# Patient Record
Sex: Male | Born: 1965 | Hispanic: Yes | Marital: Married | Smoking: Never smoker
Health system: Southern US, Community
[De-identification: ages and names within clinical notes are randomized; demographics above are authoritative.]

## PROBLEM LIST (undated history)

## (undated) DIAGNOSIS — I10 Essential (primary) hypertension: Secondary | ICD-10-CM

## (undated) HISTORY — PX: VASECTOMY: SHX75

## (undated) HISTORY — PX: FINGER SURGERY: SHX640

---

## 1999-05-24 ENCOUNTER — Emergency Department (HOSPITAL_COMMUNITY): Admission: EM | Admit: 1999-05-24 | Discharge: 1999-05-24 | Payer: Self-pay | Admitting: Emergency Medicine

## 2004-09-14 ENCOUNTER — Emergency Department (HOSPITAL_COMMUNITY): Admission: EM | Admit: 2004-09-14 | Discharge: 2004-09-14 | Payer: Self-pay | Admitting: Emergency Medicine

## 2004-09-23 ENCOUNTER — Ambulatory Visit (HOSPITAL_COMMUNITY): Admission: RE | Admit: 2004-09-23 | Discharge: 2004-09-23 | Payer: Self-pay | Admitting: Orthopaedic Surgery

## 2014-01-24 ENCOUNTER — Ambulatory Visit (INDEPENDENT_AMBULATORY_CARE_PROVIDER_SITE_OTHER): Payer: BC Managed Care – PPO | Admitting: Physician Assistant

## 2014-01-24 VITALS — BP 128/82 | HR 55 | Temp 98.0°F | Resp 18 | Ht 64.0 in | Wt 176.4 lb

## 2014-01-24 DIAGNOSIS — Z130 Encounter for screening for diseases of the blood and blood-forming organs and certain disorders involving the immune mechanism: Secondary | ICD-10-CM

## 2014-01-24 DIAGNOSIS — Z23 Encounter for immunization: Secondary | ICD-10-CM

## 2014-01-24 DIAGNOSIS — Z1329 Encounter for screening for other suspected endocrine disorder: Secondary | ICD-10-CM

## 2014-01-24 DIAGNOSIS — Z131 Encounter for screening for diabetes mellitus: Secondary | ICD-10-CM

## 2014-01-24 DIAGNOSIS — Z13220 Encounter for screening for lipoid disorders: Secondary | ICD-10-CM

## 2014-01-24 DIAGNOSIS — Z0000 Encounter for general adult medical examination without abnormal findings: Secondary | ICD-10-CM

## 2014-01-24 DIAGNOSIS — Z1159 Encounter for screening for other viral diseases: Secondary | ICD-10-CM

## 2014-01-24 DIAGNOSIS — Z1211 Encounter for screening for malignant neoplasm of colon: Secondary | ICD-10-CM

## 2014-01-24 DIAGNOSIS — Z125 Encounter for screening for malignant neoplasm of prostate: Secondary | ICD-10-CM

## 2014-01-24 LAB — POCT CBC
Granulocyte percent: 55.7 %G (ref 37–80)
HEMATOCRIT: 50.3 % (ref 43.5–53.7)
HEMOGLOBIN: 16.3 g/dL (ref 14.1–18.1)
LYMPH, POC: 2 (ref 0.6–3.4)
MCH: 30.2 pg (ref 27–31.2)
MCHC: 32.5 g/dL (ref 31.8–35.4)
MCV: 92.8 fL (ref 80–97)
MID (cbc): 0.2 (ref 0–0.9)
MPV: 7.8 fL (ref 0–99.8)
POC Granulocyte: 2.8 (ref 2–6.9)
POC LYMPH PERCENT: 39.8 %L (ref 10–50)
POC MID %: 4.5 % (ref 0–12)
Platelet Count, POC: 214 10*3/uL (ref 142–424)
RBC: 5.42 M/uL (ref 4.69–6.13)
RDW, POC: 12.8 %
WBC: 5.1 10*3/uL (ref 4.6–10.2)

## 2014-01-24 LAB — POCT URINALYSIS DIPSTICK
Bilirubin, UA: NEGATIVE
Glucose, UA: NEGATIVE
Ketones, UA: NEGATIVE
Leukocytes, UA: NEGATIVE
NITRITE UA: NEGATIVE
Protein, UA: NEGATIVE
Spec Grav, UA: 1.025
UROBILINOGEN UA: 0.2
pH, UA: 6

## 2014-01-24 LAB — POCT UA - MICROSCOPIC ONLY
Bacteria, U Microscopic: NEGATIVE
CASTS, UR, LPF, POC: NEGATIVE
Crystals, Ur, HPF, POC: NEGATIVE
MUCUS UA: POSITIVE
WBC, Ur, HPF, POC: NEGATIVE
Yeast, UA: NEGATIVE

## 2014-01-24 LAB — HEPATITIS C ANTIBODY: HCV Ab: NEGATIVE

## 2014-01-24 LAB — TSH: TSH: 1.974 u[IU]/mL (ref 0.350–4.500)

## 2014-01-24 NOTE — Progress Notes (Signed)
Subjective:    Patient ID: Barry Roy, male    DOB: 01/02/1966, 48 y.o.   MRN: [id number]   PCP: No PCP Per Patient  Chief Complaint  Patient presents with  . Annual Exam    No Known Allergies  There are no active problems to display for this patient.   Prior to Admission medications   Not on File    Medical, Surgical, Family and Social History reviewed and updated.  HPI  Presents for annual exam. No specific problems or concerns. Has not had a colonoscopy, as he just recently turned 48. Uncertain when he last received a tetanus vaccine, and is interested in doing that today.  His middle son, Barry Roy, a Consulting civil engineerstudent at [employer] studying exercise science in preparation for application to PT school, is translating as needed.  Review of Systems  Constitutional: Negative.   HENT: Negative.   Eyes: Negative.   Respiratory: Negative.   Cardiovascular: Negative.   Gastrointestinal: Negative.   Endocrine: Negative.   Genitourinary: Negative.   Musculoskeletal: Positive for neck pain (intermittent neck pain at work; history of a board falling on him).  Skin: Negative.   Allergic/Immunologic: Negative.   Neurological: Negative.   Psychiatric/Behavioral: Negative.        Objective:   Physical Exam  Constitutional: He is oriented to person, place, and time. Vital signs are normal. He appears well-developed and well-nourished. He is active and cooperative.  Non-toxic appearance. He does not have a sickly appearance. He does not appear ill. No distress.  BP 128/82 mmHg  Pulse 55  Temp(Src) 98 F (36.7 C) (Oral)  Resp 18  Ht 5\' 4"  (1.626 m)  Wt 176 lb 6.4 oz (80.015 kg)  BMI 30.26 kg/m2  SpO2 98%   HENT:  Head: Normocephalic and atraumatic.  Right Ear: Hearing, tympanic membrane, external ear and ear canal normal.  Left Ear: Hearing, tympanic membrane, external ear and ear canal normal.  Nose: Nose normal.  Mouth/Throat: Uvula is midline, oropharynx is clear and  moist and mucous membranes are normal. He does not have dentures. No oral lesions. No trismus in the jaw. Normal dentition. No dental abscesses, uvula swelling, lacerations or dental caries.  Eyes: Conjunctivae, EOM and lids are normal. Pupils are equal, round, and reactive to light. Right eye exhibits no discharge. Left eye exhibits no discharge. No scleral icterus.  Fundoscopic exam:      The right eye shows no arteriolar narrowing, no AV nicking, no exudate, no hemorrhage and no papilledema.       The left eye shows no arteriolar narrowing, no AV nicking, no exudate, no hemorrhage and no papilledema.  Neck: Normal range of motion, full passive range of motion without pain and phonation normal. Neck supple. No spinous process tenderness and no muscular tenderness present. No rigidity. No tracheal deviation, no edema, no erythema and normal range of motion present. No thyromegaly present.  Cardiovascular: Normal rate, regular rhythm, S1 normal, S2 normal, normal heart sounds, intact distal pulses and normal pulses.  Exam reveals no gallop and no friction rub.   No murmur heard. Pulmonary/Chest: Effort normal and breath sounds normal. No respiratory distress. He has no wheezes. He has no rales.  Abdominal: Soft. Normal appearance and bowel sounds are normal. He exhibits no distension and no mass. There is no hepatosplenomegaly. There is no tenderness. There is no rebound and no guarding. No hernia. Hernia confirmed negative in the right inguinal area and confirmed negative in the left inguinal area.  Genitourinary: Rectum normal, prostate normal, testes normal and penis normal. Uncircumcised. No phimosis, paraphimosis, hypospadias, penile erythema or penile tenderness. No discharge found.  Musculoskeletal: Normal range of motion. He exhibits no edema or tenderness.       Right shoulder: Normal.       Left shoulder: Normal.       Right elbow: Normal.      Left elbow: Normal.       Right wrist: Normal.        Left wrist: Normal.       Right hip: Normal.       Left hip: Normal.       Right knee: Normal.       Left knee: Normal.       Right ankle: Normal. Achilles tendon normal.       Left ankle: Normal. Achilles tendon normal.       Cervical back: Normal. He exhibits normal range of motion, no tenderness, no bony tenderness, no swelling, no edema, no deformity, no laceration, no pain, no spasm and normal pulse.       Thoracic back: Normal.       Lumbar back: Normal.       Right upper arm: Normal.       Left upper arm: Normal.       Right forearm: Normal.       Left forearm: Normal.       Right hand: Normal.       Left hand: He exhibits deformity (5th finger, consistent with previous fracture and surgical repair). He exhibits normal range of motion, no tenderness, no bony tenderness, normal capillary refill, no laceration and no swelling. Normal sensation noted. Normal strength noted.       Right upper leg: Normal.       Left upper leg: Normal.       Right lower leg: Normal.       Left lower leg: Normal.       Right foot: Normal.       Left foot: Normal.  Lymphadenopathy:       Head (right side): No submental, no submandibular, no tonsillar, no preauricular, no posterior auricular and no occipital adenopathy present.       Head (left side): No submental, no submandibular, no tonsillar, no preauricular, no posterior auricular and no occipital adenopathy present.    He has no cervical adenopathy.       Right: No inguinal and no supraclavicular adenopathy present.       Left: No inguinal and no supraclavicular adenopathy present.  Neurological: He is alert and oriented to person, place, and time. He has normal strength and normal reflexes. He displays no tremor. No cranial nerve deficit. He exhibits normal muscle tone. Coordination and gait normal.  Skin: Skin is warm, dry and intact. No abrasion, no ecchymosis, no laceration, no lesion and no rash noted. He is not diaphoretic. No cyanosis  or erythema. No pallor. Nails show no clubbing.  Psychiatric: He has a normal mood and affect. His speech is normal and behavior is normal. Judgment and thought content normal. Cognition and memory are normal.   Results for orders placed or performed in visit on 01/24/14  POCT CBC  Result Value Ref Range   WBC 5.1 4.6 - 10.2 K/uL   Lymph, poc 2.0 0.6 - 3.4   POC LYMPH PERCENT 39.8 10 - 50 %L   MID (cbc) 0.2 0 - 0.9   POC MID % 4.5 0 -  12 %M   POC Granulocyte 2.8 2 - 6.9   Granulocyte percent 55.7 37 - 80 %G   RBC 5.42 4.69 - 6.13 M/uL   Hemoglobin 16.3 14.1 - 18.1 g/dL   HCT, POC 16.1 09.6 - 53.7 %   MCV 92.8 80 - 97 fL   MCH, POC 30.2 27 - 31.2 pg   MCHC 32.5 31.8 - 35.4 g/dL   RDW, POC 04.5 %   Platelet Count, POC 214 142 - 424 K/uL   MPV 7.8 0 - 99.8 fL  POCT UA - Microscopic Only  Result Value Ref Range   WBC, Ur, HPF, POC negative    RBC, urine, microscopic 1-5    Bacteria, U Microscopic negative    Mucus, UA positive    Epithelial cells, urine per micros 0-1    Crystals, Ur, HPF, POC negative    Casts, Ur, LPF, POC negative    Yeast, UA negative   POCT urinalysis dipstick  Result Value Ref Range   Color, UA yellow    Clarity, UA clear    Glucose, UA negative    Bilirubin, UA negative    Ketones, UA negative    Spec Grav, UA 1.025    Blood, UA small    pH, UA 6.0    Protein, UA negative    Urobilinogen, UA 0.2    Nitrite, UA negative    Leukocytes, UA Negative           Assessment & Plan:  1. Annual physical exam Age appropriate anticipatory guidance provided.  2. Need for influenza vaccination - Flu Vaccine QUAD 36+ mos IM  3. Need for Tdap vaccination - Tdap vaccine greater than or equal to 7yo IM  4. Screening for deficiency anemia - POCT CBC  5. Screening for hyperlipidemia - Lipid panel  6. Screening for diabetes mellitus - POCT UA - Microscopic Only - POCT urinalysis dipstick - Comprehensive metabolic panel  7. Screening for prostate  cancer - PSA  8. Screening for colon cancer - Ambulatory referral to Gastroenterology  9. Need for hepatitis C screening test - Hepatitis C antibody  10. Screening for thyroid disorder - TSH   Fernande Bras, PA-C Physician Assistant-Certified [facility]

## 2014-01-24 NOTE — Patient Instructions (Signed)

## 2014-01-24 NOTE — Addendum Note (Signed)
Addended by: Fernande BrasJEFFERY, [place] Cantero S on: 01/24/2014 03:46 PM   Modules accepted: Level of Service

## 2014-01-26 LAB — LIPID PANEL
CHOL/HDL RATIO: 3.5 ratio
CHOLESTEROL: 137 mg/dL (ref 0–200)
HDL: 39 mg/dL — AB (ref >39)
LDL Cholesterol: 78 mg/dL (ref 0–99)
TRIGLYCERIDES: 98 mg/dL (ref <150)
VLDL: 20 mg/dL (ref 0–40)

## 2014-01-26 LAB — COMPREHENSIVE METABOLIC PANEL
ALK PHOS: 48 U/L (ref 39–117)
ALT: 22 U/L (ref 0–53)
AST: 19 U/L (ref 0–37)
Albumin: 4.6 g/dL (ref 3.5–5.2)
BILIRUBIN TOTAL: 0.6 mg/dL (ref 0.2–1.2)
BUN: 11 mg/dL (ref 6–23)
CO2: 27 mEq/L (ref 19–32)
CREATININE: 0.8 mg/dL (ref 0.50–1.35)
Calcium: 9.4 mg/dL (ref 8.4–10.5)
Chloride: 105 mEq/L (ref 96–112)
Glucose, Bld: 106 mg/dL — ABNORMAL HIGH (ref 70–99)
Potassium: 4.1 mEq/L (ref 3.5–5.3)
Sodium: 140 mEq/L (ref 135–145)
Total Protein: 7.6 g/dL (ref 6.0–8.3)

## 2014-01-26 LAB — PSA: PSA: 0.66 ng/mL (ref <=4.00)

## 2014-02-01 ENCOUNTER — Ambulatory Visit (INDEPENDENT_AMBULATORY_CARE_PROVIDER_SITE_OTHER): Payer: BC Managed Care – PPO | Admitting: Physician Assistant

## 2014-02-01 VITALS — BP 138/82 | HR 65 | Temp 97.9°F | Resp 18 | Ht 64.0 in | Wt 176.0 lb

## 2014-02-01 DIAGNOSIS — E786 Lipoprotein deficiency: Secondary | ICD-10-CM

## 2014-02-01 DIAGNOSIS — R739 Hyperglycemia, unspecified: Secondary | ICD-10-CM

## 2014-02-01 LAB — POCT GLYCOSYLATED HEMOGLOBIN (HGB A1C): Hemoglobin A1C: 5.4

## 2014-02-01 NOTE — Progress Notes (Signed)
   Subjective:    Patient ID: Barry Roy, male    DOB: 11/16/1965, 48 y.o.   MRN: [id number]  PCP: No PCP Per Patient  Chief Complaint  Patient presents with  . Follow-up   Medications, allergies, past medical history, surgical history, family history, social history and problem list reviewed and updated.  HPI  [age] yom with no significant PMH returns to clinic for lab follow up.  Was seen here last week for a CPE. Everything was normal at that time. Was given vaccines, referred to preventative testing, and had labs drawn. All labs returned normal except glucose was slightly elevated. Attempted to add on A1C to previous labs but that couldn't be done. He returns today for A1C testing.   His HDL was also very slightly decreased. Lipid panel otherwise normal.  Review of Systems No CP, SOB.     Objective:   Physical Exam  No PE - pt just had CPE last week. No new issues, aches, pains, complaints since that visit.   Results for orders placed or performed in visit on 02/01/14  POCT glycosylated hemoglobin (Hb A1C)  Result Value Ref Range   Hemoglobin A1C 5.4       Assessment & Plan:   48 yom with no significant PMH returns to clinic for lab follow up.  Blood glucose elevated - Plan: POCT glycosylated hemoglobin (Hb A1C) --A1C excellent, no prediabetes or diabetes --Continue exercise and limiting sweets  Low HDL (under 40) --Encouraged moderate aerobic exercise  Donnajean Lopesodd M. Adda Stokes, PA-C Physician Assistant-Certified [facility]  02/01/2014 8:30 AM

## 2014-02-01 NOTE — Progress Notes (Signed)
The patient was discussed with me and I agree with the diagnosis and treatment plan.  

## 2014-02-01 NOTE — Patient Instructions (Signed)
Your A1C which looks at your blood glucose levels over the past 3 months was excellent. You don't have prediabetes or diabetes. Keep up the good work with exercise and limiting your sweets.  Your HDL (good) cholesterol was just very slightly low. This is a number that you want to be high. Please be sure to get moderate aerobic exercise most days of the week to try to increase this number.

## 2014-03-16 ENCOUNTER — Encounter: Payer: Self-pay | Admitting: Internal Medicine

## 2014-06-08 ENCOUNTER — Ambulatory Visit (INDEPENDENT_AMBULATORY_CARE_PROVIDER_SITE_OTHER): Payer: No Typology Code available for payment source | Admitting: Family Medicine

## 2014-06-08 VITALS — BP 132/86 | HR 62 | Temp 97.7°F | Resp 16 | Ht 63.75 in | Wt 179.0 lb

## 2014-06-08 DIAGNOSIS — H02823 Cysts of right eye, unspecified eyelid: Secondary | ICD-10-CM | POA: Diagnosis not present

## 2014-06-08 DIAGNOSIS — H109 Unspecified conjunctivitis: Secondary | ICD-10-CM

## 2014-06-08 MED ORDER — OFLOXACIN 0.3 % OP SOLN: 1.0000 [drp] | Freq: Four times a day (QID) | OPHTHALMIC | Status: DC

## 2014-06-08 MED ORDER — DOXYCYCLINE HYCLATE 100 MG PO CAPS: 100.0000 mg | ORAL_CAPSULE | Freq: Two times a day (BID) | ORAL | Status: DC

## 2014-06-08 NOTE — Patient Instructions (Signed)
Take the doxycycline one pill twice daily at breakfast and supper. Do not take it with any milk or dairy products. Take for 10 days.  Take the ofloxacin eyedrops 1 or 2 drops in the right eye 4 times daily  If not improving over the next 5 or 6 days please return, or if worse at any time please return

## 2014-06-08 NOTE — Progress Notes (Signed)
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 About 4 months ago he got something in his eye and irritated it. He used some over-the-counter eyedrops and it resolved. However over the last for 5 days he's developed redness and irritation of the eye and a swollen area in the middle of his right upper eyelid.  Objective: Eyes PERRLA. Fundi benign. The right eye is minimally injected. The right upper lid has a systolic cystic area in the mid portion of the lid, not at the eyelash margin but about 5 mm above that. The cyst appears to be a little bit deep in the skin, not yet coming to a head. It is only minimally erythematous. Left eye looks entirely normal. His throat is clear. Neck supple without nodes. No carotid bruits. Chest clear.  Assessment: Cyst right upper eyelid Mild conjunctivitis  Plan: Floxin eyedrops Doxycycline 1 twice daily  If not improving over the next 4 or 5 days or if worse at anytime he is to return.

## 2015-05-23 ENCOUNTER — Ambulatory Visit (INDEPENDENT_AMBULATORY_CARE_PROVIDER_SITE_OTHER): Payer: BLUE CROSS/BLUE SHIELD | Admitting: Family Medicine

## 2015-05-23 ENCOUNTER — Ambulatory Visit (INDEPENDENT_AMBULATORY_CARE_PROVIDER_SITE_OTHER): Payer: BLUE CROSS/BLUE SHIELD

## 2015-05-23 VITALS — BP 118/72 | HR 72 | Temp 98.2°F | Resp 16 | Ht 63.75 in | Wt 182.0 lb

## 2015-05-23 DIAGNOSIS — M25562 Pain in left knee: Secondary | ICD-10-CM

## 2015-05-23 DIAGNOSIS — M1712 Unilateral primary osteoarthritis, left knee: Secondary | ICD-10-CM

## 2015-05-23 DIAGNOSIS — M179 Osteoarthritis of knee, unspecified: Secondary | ICD-10-CM | POA: Diagnosis not present

## 2015-05-23 MED ORDER — MELOXICAM 7.5 MG PO TABS: 7.5000 mg | ORAL_TABLET | Freq: Every day | ORAL | Status: DC

## 2015-05-23 NOTE — Progress Notes (Addendum)
Subjective:    Patient ID: Barry Roy, male    DOB: [birth year], 50 y.o.   MRN: [id number] By signing my name below, I, Littie Deeds, attest that this documentation has been prepared under the direction and in the presence of Meredith Staggers, MD.  Electronically Signed: Littie Deeds, Medical Scribe. 05/23/2015. 8:48 AM.  HPI HPI Comments: Barry Roy is a 50 y.o. male who presents to the [facility] complaining of sudden onset, left knee pain that started 6 months ago after he twisted his knee. He reports having associated knee swelling. Patient did not see a doctor for this. He has twisted his knee before, but this usually resolves on its own. He thought his knee pain would resolve, but it still persists. His knee has given out on occasion. He did try an OTC knee brace with a reinforced bar but without relief. He has not tried any medications. Patient has occasional pain in his quads and hips, but he is primarily here for the knee pain.  Patient works in [employer] representative. He is here today with his son, who is translating.  There are no active problems to display for this patient.  No past medical history on file. Past Surgical History  Procedure Laterality Date  . Finger surgery Left     fifth finger; work injury   No Known Allergies Prior to Admission medications   Not on File   Social History   Social History  . Marital Status: Married    Spouse Name: [name]  . Number of Children: 3  . Years of Education: 5th grade   Occupational History  . Water quality scientist   Social History Main Topics  . Smoking status: Never Smoker   . Smokeless tobacco: Never Used  . Alcohol Use: No  . Drug Use: No  . Sexual Activity:    Partners: Female   Other Topics Concern  . Not on file   Social History Narrative   From [place], [place]. Moved to the [place] in 1985.   Lives with his wife, son and daughter-in-law.   2 sons live on-campus at [employer] [employer]  and [address].     Review of Systems  Musculoskeletal: Positive for joint swelling and arthralgias.  Neurological: Positive for weakness.       Objective:   Physical Exam  Constitutional: He is oriented to person, place, and time. He appears well-developed and well-nourished. No distress.  HENT:  Head: Normocephalic and atraumatic.  Mouth/Throat: Oropharynx is clear and moist. No oropharyngeal exudate.  Eyes: Pupils are equal, round, and reactive to light.  Neck: Neck supple.  Cardiovascular: Normal rate.   Pulmonary/Chest: Effort normal.  Musculoskeletal: He exhibits tenderness. He exhibits no edema.  Left ankle non-tender. Left tib/fib non-tender. Fibular head non-tender. No apparent effusion in left knee. Tender along medial joint line. Full flexion, full extension. Negative varus. Negative valgus. Positive McMurray with external rotation and flexion. Some guarding and difficulty relaxing on exam. Negative Lachman.  Neurological: He is alert and oriented to person, place, and time. No cranial nerve deficit.  Skin: Skin is warm and dry. No rash noted. No erythema.  Left knee: Skin intact, no erythema or warmth.  Psychiatric: He has a normal mood and affect. His behavior is normal.  Nursing note and vitals reviewed.   Filed Vitals:   05/23/15 0832  BP: 118/72  Pulse: 72  Temp: 98.2 F (36.8 C)  TempSrc: Oral  Resp:  16  Height: 5' 3.75" (1.619 m)  Weight: 182 lb (82.555 kg)  SpO2: 97%    Dg Knee Complete 4 Views Left  05/23/2015  CLINICAL DATA:  Knee pain off and on about 6 months LEFT KNEE - COMPLETE 4+ VIEW COMPARISON:  None. FINDINGS: No fracture of the proximal tibia or distal femur. Patella is normal. No joint effusion. There is spurring of the medial compartment and osteophytosis and mild joint space narrowing. IMPRESSION: 1. No acute findings of the knee. 2. Medial compartment mild osteoarthritis. Electronically Signed   By: Genevive BiStewart  Edmunds M.D.   On: 05/23/2015 09:42         Assessment & Plan:   Barry Roy is a 50 y.o. male Left knee pain - Plan: DG Knee Complete 4 Views Left, meloxicam (MOBIC) 7.5 MG tablet, Apply knee sleeve  Osteoarthritis of left knee, unspecified osteoarthritis type - Plan: meloxicam (MOBIC) 7.5 MG tablet, Apply knee sleeve   Suspected osteoarthritis versus degenerative meniscus tear left knee, no effusion on exam, full range of motion. Decided on initial trial of Reaction unloader brace, meloxicam 7.5 mg daily.  May need steroid injection or orthopedic eval. If not improving the next 2 weeks, can have him referred to orthopedics. Can call if this is the case. Return to clinic sooner if worsening.   Meds ordered this encounter  Medications  . meloxicam (MOBIC) 7.5 MG tablet    Sig: Take 1 tablet (7.5 mg total) by mouth daily.    Dispense:  30 tablet    Refill:  0   Patient Instructions       IF you received an x-ray today, you will receive an invoice from [facility]. Please contact [facility] at [phone] with questions or concerns regarding your invoice.   IF you received labwork today, you will receive an invoice from [facility]. Please contact [facility] at [phone] with questions or concerns regarding your invoice.   Our billing staff will not be able to assist you with questions regarding bills from these companies.  You will be contacted with the lab results as soon as they are available. The fastest way to get your results is to activate your My Chart account. Instructions are located on the last page of this paperwork. If you have not heard from us regarding the results in 2 weeks, please contact this office.     Knee pain is likely due to a flare of arthritis or possible degenerative meniscus tear. This is often treated similar to arthritis pain of the knee, and may require an injection.  Initially can try meloxicam 1 pill once per day,  knee brace  as discussed, and if not improving the next 2 weeks, recommend evaluation with orthopedics for possible injection or other treatment. Return sooner if worse.    I personally performed the services described in this documentation, which was scribed in my presence. The recorded information has been reviewed and considered, and addended by me as needed.

## 2015-05-23 NOTE — Patient Instructions (Addendum)
     IF you received an x-ray today, you will receive an invoice from [facility]. Please contact [facility] at [phone] with questions or concerns regarding your invoice.   IF you received labwork today, you will receive an invoice from [facility]. Please contact [facility] at [phone] with questions or concerns regarding your invoice.   Our billing staff will not be able to assist you with questions regarding bills from these companies.  You will be contacted with the lab results as soon as they are available. The fastest way to get your results is to activate your My Chart account. Instructions are located on the last page of this paperwork. If you have not heard from us regarding the results in 2 weeks, please contact this office.     Knee pain is likely due to a flare of arthritis or possible degenerative meniscus tear. This is often treated similar to arthritis pain of the knee, and may require an injection.  Initially can try meloxicam 1 pill once per day,  knee brace as discussed, and if not improving the next 2 weeks, recommend evaluation with orthopedics for possible injection or other treatment. Return sooner if worse.

## 2015-12-18 ENCOUNTER — Ambulatory Visit (INDEPENDENT_AMBULATORY_CARE_PROVIDER_SITE_OTHER): Payer: BLUE CROSS/BLUE SHIELD | Admitting: Family Medicine

## 2015-12-18 VITALS — BP 126/84 | HR 73 | Temp 98.1°F | Resp 17 | Ht 64.0 in | Wt 185.0 lb

## 2015-12-18 DIAGNOSIS — L819 Disorder of pigmentation, unspecified: Secondary | ICD-10-CM | POA: Diagnosis not present

## 2015-12-18 DIAGNOSIS — L282 Other prurigo: Secondary | ICD-10-CM

## 2015-12-18 MED ORDER — PREDNISONE 20 MG PO TABS: 40.0000 mg | ORAL_TABLET | Freq: Every day | ORAL | 0 refills | Status: DC

## 2015-12-18 NOTE — Patient Instructions (Addendum)
  Zyrtec 10mg  cada dia, prednisone 40mg  cada dia por 3 dias, y [place] jabon.  Aveeno despues de banarse. voy a referir a specialista de piel. regrese si empeorse.    Erupcin cutnea (Rash)  Una erupcin es un cambio en el color o en la textura de la piel. Hay diferentes tipos de erupcin. Puede ser que tenga otros sntomas que acompaan la erupcin.  CAUSAS   Infecciones.  Reacciones alrgicas. Esto incluye alergias a mascotas o a medicamentos.  Ciertos medicamentos.  Exposicin a ciertas sustancias qumicas, jabones o cosmticos.  El calor.  Exposicin a plantas venenosas.  Tumores, tanto cancerosos como no cancerosos. SNTOMAS   Enrojecimiento.  Piel escamosa.  Picazn en la piel.  Piel seca o agrietada.  Bultos.  Ampollas.  Dolor. DIAGNSTICO  El mdico har un examen fsico para determinar qu tipo de erupcin tiene. Podrn tomarle una muestra de piel (biopsia) para ser examinada en el microscopio.  TRATAMIENTO  El tratamiento depende del tipo de erupcin que usted tenga. El mdico puede prescribirle algunos medicamentos. En los casos graves, Pension scheme managernecesitar ver a un mdico Engineer, productionespecialista en piel (dermatlogo).  INSTRUCCIONES PARA EL CUIDADO DOMICILIARIO  Evite las sustancias que han causado la erupcin.  No se rasque la lesin. Puede ocasionarle una infeccin.  Tome baos con agua fresca para Psychologist, sport and exercisedetener la picazn.  Tome slo medicamentos de venta libre o recetados, segn las indicaciones del mdico.  Cumpla con todas las visitas de control, segn le indique su mdico. SOLICITE ATENCIN MDICA DE INMEDIATO SI:  El dolor, la hinchazn o el enrojecimiento [place]an.  Tiene fiebre.  Tiene sntomas nuevos o graves.  Siente dolor en el cuerpo, diarrea o vmitos.  La erupcin no mejora en el trmino de 3 das. ASEGRESE DE QUE:   Comprende estas instrucciones.  Controlar su enfermedad.  Solicitar ayuda de inmediato si no mejora o si empeora.   Esta  informacin no tiene Theme park managercomo fin reemplazar el consejo del mdico. Asegrese de hacerle al mdico cualquier pregunta que tenga.   Document Released: 11/23/2004 Document Revised: 11/08/2011 Elsevier Interactive Patient Education [employer]2016 Elsevier Inc.    IF you received an x-ray today, you will receive an invoice from [facility]. Please contact [facility] at [phone] with questions or concerns regarding your invoice.   IF you received labwork today, you will receive an invoice from [facility]. Please contact [facility] at [phone] with questions or concerns regarding your invoice.   Our billing staff will not be able to assist you with questions regarding bills from these companies.  You will be contacted with the lab results as soon as they are available. The fastest way to get your results is to activate your My Chart account. Instructions are located on the last page of this paperwork. If you have not heard from us regarding the results in 2 weeks, please contact this office.

## 2015-12-18 NOTE — Progress Notes (Signed)
Subjective:  By signing my name below, I, Barry Roy, attest that this documentation has been prepared under the direction and in the presence of Meredith Staggers, MD. Electronically Signed: Stann Roy, Scribe. 12/18/2015 , 1:48 PM .  Patient was seen in Room 8 .   Patient ID: Barry Roy, male    DOB: 11/18/1965, 51 y.o.   MRN: [id number] Chief Complaint  Patient presents with  . Rash    "all over body" Pt states he gets it every summer, but its not going away this year.    HPI Barry Roy is a 50 y.o. male Here for rash.   Patient complains of an itchy rash which initially started over his abdomen about 3 months ago. He notes that he's had similar rash in the past summers, but they would usually resolve when the weather becomes cooler. He noticed this episode of rash has worsened, now spreading over his arms and his legs 2 months ago. He denies using any treatments over the affected areas.   He also noticed white marks under his armpits. He reports using Dove soap, and the same bar deodorant. He denies history of diabetes. He works in [employer] as a Scientist, water quality. He speaks some English but his son is here to help translate.   There are no active problems to display for this patient.  No past medical history on file. Past Surgical History:  Procedure Laterality Date  . FINGER SURGERY Left    fifth finger; work injury   No Known Allergies Prior to Admission medications   Not on File   Social History   Social History  . Marital status: Married    Spouse name: [name]  . Number of children: 3  . Years of education: 5th grade   Occupational History  . Water quality scientist   Social History Main Topics  . Smoking status: Never Smoker  . Smokeless tobacco: Never Used  . Alcohol use No  . Drug use: No  . Sexual activity: Yes    Partners: Female   Other Topics Concern  . Not on file   Social History Narrative   From [place],  [place]. Moved to the [place] in 1985.   Lives with his wife, son and daughter-in-law.   2 sons live on-campus at [facility] and [address].   Review of Systems  Constitutional: Negative for fatigue and unexpected weight change.  Eyes: Negative for visual disturbance.  Respiratory: Negative for cough, chest tightness and shortness of breath.   Cardiovascular: Negative for chest pain, palpitations and leg swelling.  Gastrointestinal: Negative for abdominal pain and blood in stool.  Skin: Positive for rash.  Neurological: Negative for dizziness, light-headedness and headaches.       Objective:   Physical Exam  Constitutional: He is oriented to person, place, and time. He appears well-developed and well-nourished. No distress.  HENT:  Head: Normocephalic and atraumatic.  Mouth/Throat: No oral lesions.  Eyes: EOM are normal. Pupils are equal, round, and reactive to light.  Neck: Neck supple.  Cardiovascular: Normal rate.   Pulmonary/Chest: Effort normal. No respiratory distress.  Musculoskeletal: Normal range of motion.  Neurological: He is alert and oriented to person, place, and time.  Skin: Skin is warm and dry.  Scattered excoriated patches with few fine papules, some dry skin on the front of his skin bilaterally with few hypopigmentation, scattered excoriated papules over upper thighs, upper back and shoulders, abdomen, dorsal arms upper and  lower, few over his dorsal hands but no interdigital lesions; also has a few areas on his lower back and just behind his axilla (R>L), middle of his back is spared; few scattered hypopigmentation over his axillar bilaterally  Psychiatric: He has a normal mood and affect. His behavior is normal.  Nursing note and vitals reviewed.   Vitals:   12/18/15 1320  BP: 126/84  Pulse: 73  Resp: 17  Temp: 98.1 F (36.7 C)  TempSrc: Oral  SpO2: 97%  Weight: 185 lb (83.9 kg)  Height: 5\' 4"  (1.626 m)      Assessment & Plan:    Barry Roy is a 50 y.o.  male Hypopigmentation - Plan: Ambulatory referral to Dermatology  Pruritic rash - Plan: predniSONE (DELTASONE) 20 MG tablet, Ambulatory referral to Dermatology  Recurrent pruritic rash, diffuse. Not just in sun exposed areas, so photodermatitis less likely. Contact dermatitis possible, but diffuse nature and persistent symptoms throughout the summer without known cause. Fungal infection also possible as only present in summer, but does not spread across upper back as typically seen with tinea versicolor. He does have some associated areas of hypopigmentation in the axilla and lower extremities, appears to be vitiligo.   - Based on pruritic nature, will treat with 3 days of prednisone, Zyrtec 10 mg daily every day for now, and refer to dermatology. Additionally he can try Aveeno lotion as some dry areas of skin seen.   -RTC precautions if worsening  Meds ordered this encounter  Medications  . predniSONE (DELTASONE) 20 MG tablet    Sig: Take 2 tablets (40 mg total) by mouth daily with breakfast.    Dispense:  6 tablet    Refill:  0   Patient Instructions    Zyrtec 10mg  cada dia, prednisone 40mg  cada dia por 3 dias, y [place].  Aveeno despues de banarse. voy a referir a specialista de piel. regrese si empeorse.    Erupcin cutnea (Rash)  Una erupcin es un cambio en el color o en la textura de la piel. Hay diferentes tipos de erupcin. Puede ser que tenga otros sntomas que acompaan la erupcin.  CAUSAS   Infecciones.  Reacciones alrgicas. Esto incluye alergias a mascotas o a medicamentos.  Ciertos medicamentos.  Exposicin a ciertas sustancias qumicas, jabones o cosmticos.  El calor.  Exposicin a plantas venenosas.  Tumores, tanto cancerosos como no cancerosos. SNTOMAS   Enrojecimiento.  Piel escamosa.  Picazn en la piel.  Piel seca o agrietada.  Bultos.  Ampollas.  Dolor. DIAGNSTICO  El mdico har un examen fsico para determinar qu tipo de  erupcin tiene. Podrn tomarle una muestra de piel (biopsia) para ser examinada en el microscopio.  TRATAMIENTO  El tratamiento depende del tipo de erupcin que usted tenga. El mdico puede prescribirle algunos medicamentos. En los casos graves, Pension scheme manager ver a un mdico Engineer, production (dermatlogo).  INSTRUCCIONES PARA EL CUIDADO DOMICILIARIO  Evite las sustancias que han causado la erupcin.  No se rasque la lesin. Puede ocasionarle una infeccin.  Tome baos con agua fresca para Psychologist, sport and exercise.  Tome slo medicamentos de venta libre o recetados, segn las indicaciones del mdico.  Cumpla con todas las visitas de control, segn le indique su mdico. SOLICITE ATENCIN MDICA DE INMEDIATO SI:  El dolor, la hinchazn o el enrojecimiento [place].  Tiene fiebre.  Tiene sntomas nuevos o graves.  Siente dolor en el cuerpo, diarrea o vmitos.  La erupcin no mejora en el trmino de  3 das. ASEGRESE DE QUE:   Comprende estas instrucciones.  Controlar su enfermedad.  Solicitar ayuda de inmediato si no mejora o si empeora.   Esta informacin no tiene Theme park managercomo fin reemplazar el consejo del mdico. Asegrese de hacerle al mdico cualquier pregunta que tenga.   Document Released: 11/23/2004 Document Revised: 11/08/2011 Elsevier Interactive Patient Education [employer]2016 Elsevier Inc.    IF you received an x-ray today, you will receive an invoice from [facility]. Please contact [facility] at [phone] with questions or concerns regarding your invoice.   IF you received labwork today, you will receive an invoice from [facility]. Please contact [facility] at [phone] with questions or concerns regarding your invoice.   Our billing staff will not be able to assist you with questions regarding bills from these companies.  You will be contacted with the lab results as soon as they are available. The fastest way to get your results  is to activate your My Chart account. Instructions are located on the last page of this paperwork. If you have not heard from us regarding the results in 2 weeks, please contact this office.       I personally performed the services described in this documentation, which was scribed in my presence. The recorded information has been reviewed and considered, and addended by me as needed.   Signed,   Meredith StaggersJeffrey Jaquail Mclees, MD [facility].  12/19/15 10:05 PM

## 2016-03-28 ENCOUNTER — Ambulatory Visit (INDEPENDENT_AMBULATORY_CARE_PROVIDER_SITE_OTHER): Payer: BLUE CROSS/BLUE SHIELD

## 2016-03-28 ENCOUNTER — Ambulatory Visit (INDEPENDENT_AMBULATORY_CARE_PROVIDER_SITE_OTHER): Payer: BLUE CROSS/BLUE SHIELD | Admitting: Physician Assistant

## 2016-03-28 DIAGNOSIS — M542 Cervicalgia: Secondary | ICD-10-CM | POA: Diagnosis not present

## 2016-03-28 DIAGNOSIS — M25522 Pain in left elbow: Secondary | ICD-10-CM

## 2016-03-28 DIAGNOSIS — S42402A Unspecified fracture of lower end of left humerus, initial encounter for closed fracture: Secondary | ICD-10-CM

## 2016-03-28 MED ORDER — HYDROCODONE-ACETAMINOPHEN 5-325 MG PO TABS: 1.0000 | ORAL_TABLET | Freq: Four times a day (QID) | ORAL | 0 refills | Status: DC | PRN

## 2016-03-28 MED ORDER — CYCLOBENZAPRINE HCL 5 MG PO TABS: 5.0000 mg | ORAL_TABLET | Freq: Three times a day (TID) | ORAL | 0 refills | Status: DC | PRN

## 2016-03-28 MED ORDER — MELOXICAM 7.5 MG PO TABS: 7.5000 mg | ORAL_TABLET | Freq: Every day | ORAL | 0 refills | Status: DC

## 2016-03-28 NOTE — Patient Instructions (Addendum)
  I have referred you specialist for your elbow.  If you have not heard by the end of the week let me know.  Take the pain medication as you need no additional tylenol or motrin.     IF you received an x-ray today, you will receive an invoice from [facility]. Please contact [facility] at [phone] with questions or concerns regarding your invoice.   IF you received labwork today, you will receive an invoice from [facility]. Please contact [facility] at [phone] with questions or concerns regarding your invoice.   Our billing staff will not be able to assist you with questions regarding bills from these companies.  You will be contacted with the lab results as soon as they are available. The fastest way to get your results is to activate your My Chart account. Instructions are located on the last page of this paperwork. If you have not heard from us regarding the results in 2 weeks, please contact this office.
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
## 2016-04-05 ENCOUNTER — Ambulatory Visit (INDEPENDENT_AMBULATORY_CARE_PROVIDER_SITE_OTHER): Payer: BLUE CROSS/BLUE SHIELD | Admitting: Physician Assistant

## 2016-04-05 DIAGNOSIS — M542 Cervicalgia: Secondary | ICD-10-CM | POA: Diagnosis not present

## 2016-04-05 DIAGNOSIS — S52042A Displaced fracture of coronoid process of left ulna, initial encounter for closed fracture: Secondary | ICD-10-CM

## 2016-04-05 MED ORDER — MELOXICAM 7.5 MG PO TABS: 7.5000 mg | ORAL_TABLET | Freq: Every day | ORAL | 2 refills | Status: DC
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
## 2016-04-12 ENCOUNTER — Ambulatory Visit (INDEPENDENT_AMBULATORY_CARE_PROVIDER_SITE_OTHER): Payer: BLUE CROSS/BLUE SHIELD | Admitting: Physician Assistant

## 2016-04-17 NOTE — Progress Notes (Signed)
Yes but it is a coronoid fracture of the elbow . Can we change this?

## 2016-04-19 ENCOUNTER — Encounter (INDEPENDENT_AMBULATORY_CARE_PROVIDER_SITE_OTHER): Payer: Self-pay | Admitting: Physician Assistant

## 2016-04-19 ENCOUNTER — Ambulatory Visit (INDEPENDENT_AMBULATORY_CARE_PROVIDER_SITE_OTHER): Payer: BLUE CROSS/BLUE SHIELD

## 2016-04-19 ENCOUNTER — Ambulatory Visit (INDEPENDENT_AMBULATORY_CARE_PROVIDER_SITE_OTHER): Payer: BLUE CROSS/BLUE SHIELD | Admitting: Physician Assistant

## 2016-04-19 DIAGNOSIS — S52042D Displaced fracture of coronoid process of left ulna, subsequent encounter for closed fracture with routine healing: Secondary | ICD-10-CM

## 2016-04-19 DIAGNOSIS — M542 Cervicalgia: Secondary | ICD-10-CM

## 2016-04-19 NOTE — Progress Notes (Signed)
   Office Visit Note   Patient: Barry Roy           Date of Birth: 10/17/1965           MRN: [id number] Visit Date: 04/19/2016              Requested by: No referring provider defined for this encounter. PCP: No PCP Per Patient   Assessment & Plan: Visit Diagnoses:  1. Closed displaced fracture of coronoid process of left ulna with routine healing   2. Cervicalgia     Plan: We will have him discontinue the left arm sling. He'll work on range of motion strengthening arm. No lifting greater than 5 pounds with the left arm. We'll see him back in 1 month at that time obtain AP lateral views of the left elbow.  Follow-Up Instructions: Return in about 4 weeks (around 05/17/2016).   Orders:  Orders Placed This Encounter  Procedures  . XR Elbow 2 Views Left   No orders of the defined types were placed in this encounter.     Procedures: No procedures performed   Clinical Data: No additional findings.   Subjective: Chief Complaint  Patient presents with  . Left Elbow - Pain, Follow-up  . Neck - Pain, Follow-up    HPI Mr. Barry Roy her stay 2 weeks status post left elbow coronoid fracture. He states overall that he sore still but otherwise doing well. 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 He is doing no lifting with left arm is been wearing a sling. Review of Systems   Objective: Vital Signs: There were no vitals taken for this visit.  Physical Exam  Ortho Exam After arm radial pulses 2+. Sensation grossly intact and he has full motor of the hand. He has good range of motion of the elbow tenderness over proximal ulna only. Remainder of the elbow nontender. Has mild edema in the left hand compartments otherwise soft forearm. Specialty Comments:  No specialty comments available.  Imaging: Xr Elbow 2 Views Left  Result Date: 04/19/2016 AP and lateral views of the left elbow: Shows the coronoid fracture to her remain overall  good position/ alignment. No other fractures seen about the elbow the elbow is located.    PMFS History: Patient Active Problem List   Diagnosis Date Noted  . Closed displaced fracture of coronoid process of left ulna with routine healing 04/19/2016  . Cervicalgia 04/19/2016   History reviewed. No pertinent past medical history.  Family History  Problem Relation Age of Onset  . Breast cancer Mother   . Arthritis Sister     knee    Past Surgical History:  Procedure Laterality Date  . FINGER SURGERY Left    fifth finger; work injury   Social History   Occupational History  . Water quality scientistconstruction     brick mason   Social History Main Topics  . Smoking status: Never Smoker  . Smokeless tobacco: Never Used  . Alcohol use No  . Drug use: No  . Sexual activity: Yes    Partners: Female

## 2016-05-22 ENCOUNTER — Ambulatory Visit (INDEPENDENT_AMBULATORY_CARE_PROVIDER_SITE_OTHER): Payer: BLUE CROSS/BLUE SHIELD | Admitting: Physician Assistant

## 2016-05-22 ENCOUNTER — Ambulatory Visit (INDEPENDENT_AMBULATORY_CARE_PROVIDER_SITE_OTHER): Payer: BLUE CROSS/BLUE SHIELD

## 2016-05-22 ENCOUNTER — Encounter (INDEPENDENT_AMBULATORY_CARE_PROVIDER_SITE_OTHER): Payer: Self-pay | Admitting: Physician Assistant

## 2016-05-22 DIAGNOSIS — S52042D Displaced fracture of coronoid process of left ulna, subsequent encounter for closed fracture with routine healing: Secondary | ICD-10-CM | POA: Diagnosis not present

## 2016-05-22 NOTE — Progress Notes (Signed)
   Office Visit Note   Patient: Barry Roy           Date of Birth: 08/08/1965           MRN: [id number] Visit Date: 05/22/2016              Requested by: No referring provider defined for this encounter. PCP: No PCP Per Patient   Assessment & Plan: Visit Diagnoses:  1. Closed displaced fracture of coronoid process of left ulna with routine healing     Plan: He will return to activities as tolerated. See him back on an as-needed basis or if he develops any mechanical symptoms of the elbow. Told him that he may have some pain and discomfort in the elbow for 3-6 months from the time of injury.  Follow-Up Instructions: Return if symptoms worsen or fail to improve.   Orders:  Orders Placed This Encounter  Procedures  . XR Elbow 2 Views Left   No orders of the defined types were placed in this encounter.     Procedures: No procedures performed   Clinical Data: No additional findings.   Subjective: Chief Complaint  Patient presents with  . Left Elbow - Pain    Patient returns for follow up left elbow fracture. He states that he is doing well. He denies any pain.     Review of Systems   Objective: Vital Signs: There were no vitals taken for this visit.  Physical Exam  Constitutional: He is oriented to person, place, and time. He appears well-developed and well-nourished. No distress.  Neurological: He is alert and oriented to person, place, and time.    Ortho Exam Left radial pulses 2+. He has full pronation and supination of the forearm. Full extension flexion elbow was minimal tenderness over coronoid process. No instability elbow with stressing Specialty Comments:  No specialty comments available.  Imaging: Xr Elbow 2 Views Left  Result Date: 05/22/2016 AP lateral views of the left elbow: Coronoid process of the ulna appears to be in good position. There is good callus formation about the area. No change in overall position alignment. No other  fractures seen.    PMFS History: Patient Active Problem List   Diagnosis Date Noted  . Closed displaced fracture of coronoid process of left ulna with routine healing 04/19/2016  . Cervicalgia 04/19/2016   No past medical history on file.  Family History  Problem Relation Age of Onset  . Breast cancer Mother   . Arthritis Sister     knee    Past Surgical History:  Procedure Laterality Date  . FINGER SURGERY Left    fifth finger; work injury   Social History   Occupational History  . Water quality scientistconstruction     brick mason   Social History Main Topics  . Smoking status: Never Smoker  . Smokeless tobacco: Never Used  . Alcohol use No  . Drug use: No  . Sexual activity: Yes    Partners: Female

## 2018-05-30 ENCOUNTER — Ambulatory Visit (HOSPITAL_COMMUNITY)
Admission: EM | Admit: 2018-05-30 | Discharge: 2018-05-30 | Disposition: A | Discharge status: Home / Self Care | Payer: Self-pay | Attending: Family Medicine | Admitting: Family Medicine

## 2018-05-30 ENCOUNTER — Encounter (HOSPITAL_COMMUNITY): Payer: Self-pay

## 2018-05-30 ENCOUNTER — Other Ambulatory Visit: Payer: Self-pay

## 2018-05-30 DIAGNOSIS — S29011A Strain of muscle and tendon of front wall of thorax, initial encounter: Secondary | ICD-10-CM

## 2018-05-30 DIAGNOSIS — M62838 Other muscle spasm: Secondary | ICD-10-CM

## 2018-05-30 DIAGNOSIS — M542 Cervicalgia: Secondary | ICD-10-CM

## 2018-05-30 DIAGNOSIS — J020 Streptococcal pharyngitis: Secondary | ICD-10-CM

## 2018-05-30 LAB — POCT RAPID STREP A: Streptococcus, Group A Screen (Direct): POSITIVE — AB

## 2018-05-30 MED ORDER — CYCLOBENZAPRINE HCL 10 MG PO TABS: 10.0000 mg | ORAL_TABLET | Freq: Every day | ORAL | 0 refills | Status: DC

## 2018-05-30 MED ORDER — ACETAMINOPHEN 325 MG PO TABS
650.0000 mg | ORAL_TABLET | Freq: Once | ORAL | Status: AC
  Administered 2018-05-30: 650 mg via ORAL

## 2018-05-30 MED ORDER — NAPROXEN 500 MG PO TABS: 500.0000 mg | ORAL_TABLET | Freq: Two times a day (BID) | ORAL | 0 refills | Status: DC

## 2018-05-30 MED ORDER — ACETAMINOPHEN 325 MG PO TABS
ORAL_TABLET | ORAL | Status: AC
  Filled 2018-05-30: qty 2

## 2018-05-30 MED ORDER — AMOXICILLIN 500 MG PO CAPS: 500.0000 mg | ORAL_CAPSULE | Freq: Two times a day (BID) | ORAL | 0 refills | Status: AC

## 2018-05-30 NOTE — ED Notes (Signed)
Patient verbalizes understanding of discharge instructions. Opportunity for questioning and answers were provided. Patient discharged from [facility] by CMA.  

## 2018-05-30 NOTE — ED Triage Notes (Signed)
Pt states he has had a fever off and on for 2 days. Pt states he has pain in his left shoulder and left side and neck. X 1 week.
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
## 2018-05-30 NOTE — Discharge Instructions (Signed)
Sore throat: Strep was positive Amoxicillin prescribed.  Take as directed and to completion You may try drinking warm or cold liquids to help with your symptoms Follow up with [facility] if symptoms persists Return or go to ER if you have any new or worsening symptoms fever, chills, nausea, vomiting, chest pain, chest tightness, cough, shortness of breath, wheezing, abdominal pain, changes in bowel or bladder habits, etc...  Shoulder Pain: Continue conservative management of rest, ice, and gentle stretches Take naproxen as needed for pain relief (may cause abdominal discomfort, ulcers, and GI bleeds avoid taking with other NSAIDs) Take cyclobenzaprine at nighttime for symptomatic relief. Avoid driving or operating heavy machinery while using medication. Follow up with orthopedist in 1-2 weeks for recheck and to ensure your symptoms are improving.  I have included their contact info below.   Present to ER if worsening or new symptoms (fever, chills, chest pain, abdominal pain, changes in bowel or bladder habits, pain radiating into lower legs, etc...)   You should remain isolated in your home for 7 days from symptom onset AND greater than 72 hours after symptoms resolution (absence of fever without the use of fever-reducing medication and improvement in respiratory symptoms), whichever is longer You may return to work 72 hours after symptoms have resolved.  Return to light duty, until cleared by orthopedist for full duty

## 2018-06-21 ENCOUNTER — Other Ambulatory Visit: Payer: Self-pay | Admitting: Surgery

## 2018-07-08 ENCOUNTER — Other Ambulatory Visit: Payer: Self-pay | Admitting: Surgery

## 2018-07-31 NOTE — Progress Notes (Deleted)
Per pt he is still having chest pain and back pain on the left side that started 2 months ago. Per he have patient blood pressure issues but don't take any BP meds.

## 2018-08-01 ENCOUNTER — Ambulatory Visit: Payer: Self-pay | Admitting: Family Medicine

## 2018-08-15 ENCOUNTER — Other Ambulatory Visit: Payer: Self-pay | Admitting: Surgery

## 2018-08-15 ENCOUNTER — Other Ambulatory Visit (HOSPITAL_COMMUNITY): Payer: Self-pay | Admitting: Surgery

## 2018-08-15 DIAGNOSIS — R590 Localized enlarged lymph nodes: Secondary | ICD-10-CM

## 2018-08-21 ENCOUNTER — Encounter (HOSPITAL_COMMUNITY): Payer: Self-pay

## 2018-08-21 ENCOUNTER — Ambulatory Visit (HOSPITAL_COMMUNITY)
Admission: RE | Admit: 2018-08-21 | Discharge: 2018-08-21 | Disposition: A | Discharge status: Home / Self Care | Payer: Self-pay | Source: Ambulatory Visit | Attending: Surgery | Admitting: Surgery

## 2018-08-21 ENCOUNTER — Other Ambulatory Visit: Payer: Self-pay

## 2018-08-21 DIAGNOSIS — R590 Localized enlarged lymph nodes: Secondary | ICD-10-CM | POA: Insufficient documentation

## 2018-08-21 MED ORDER — SODIUM CHLORIDE (PF) 0.9 % IJ SOLN
INTRAMUSCULAR | Status: AC
  Filled 2018-08-21: qty 50

## 2018-08-21 MED ORDER — IOHEXOL 300 MG/ML  SOLN
75.0000 mL | Freq: Once | INTRAMUSCULAR | Status: AC | PRN
  Administered 2018-08-21: 75 mL via INTRAVENOUS

## 2019-05-04 ENCOUNTER — Ambulatory Visit: Payer: Self-pay

## 2019-05-09 ENCOUNTER — Ambulatory Visit: Payer: Self-pay | Attending: Internal Medicine

## 2019-05-09 DIAGNOSIS — Z23 Encounter for immunization: Secondary | ICD-10-CM

## 2019-05-09 NOTE — Progress Notes (Signed)
   Covid-19 Vaccination Clinic  Name:  Barry Roy    MRN: [id number] DOB: Mar 03, 1965  05/09/2019  Mr. Barry Roy was observed post Covid-19 immunization for 15 minutes without incident. He was provided with Vaccine Information Sheet and instruction to access the V-Safe system.   Mr. Barry Roy was instructed to call 911 with any severe reactions post vaccine: Marland Kitchen Difficulty breathing  . Swelling of face and throat  . A fast heartbeat  . A bad rash all over body  . Dizziness and weakness   Immunizations Administered    Name Date Dose VIS Date Route   Pfizer COVID-19 Vaccine 05/09/2019  8:36 AM 0.3 mL 02/07/2019 Intramuscular   Manufacturer: [employer], [employer]   Lot: [id number]   NDC: [address]-3

## 2019-06-02 ENCOUNTER — Ambulatory Visit: Payer: Self-pay | Attending: Internal Medicine

## 2019-06-02 DIAGNOSIS — Z23 Encounter for immunization: Secondary | ICD-10-CM

## 2019-06-02 NOTE — Progress Notes (Signed)
   Covid-19 Vaccination Clinic  Name:  Barry Roy    MRN: [id number] DOB: 09-10-1965  06/02/2019  Mr. Barry Roy was observed post Covid-19 immunization for 15 minutes without incident. He was provided with Vaccine Information Sheet and instruction to access the V-Safe system.   Mr. Barry Roy was instructed to call 911 with any severe reactions post vaccine: Marland Kitchen Difficulty breathing  . Swelling of face and throat  . A fast heartbeat  . A bad rash all over body  . Dizziness and weakness   Immunizations Administered    Name Date Dose VIS Date Route   Pfizer COVID-19 Vaccine 06/02/2019 11:11 AM 0.3 mL 02/07/2019 Intramuscular   Manufacturer: [employer], [employer]   Lot: [id number]   NDC: [address]-8

## 2020-01-30 ENCOUNTER — Ambulatory Visit: Payer: Self-pay | Attending: Internal Medicine

## 2020-01-30 DIAGNOSIS — Z23 Encounter for immunization: Secondary | ICD-10-CM

## 2020-01-30 NOTE — Progress Notes (Signed)
   Covid-19 Vaccination Clinic  Name:  Barry Roy    MRN: [id number] DOB: Jul 22, 1965  01/30/2020  Mr. Barry Roy was observed post Covid-19 immunization for 15 minutes without incident. He was provided with Vaccine Information Sheet and instruction to access the V-Safe system.   Mr. Barry Roy was instructed to call 911 with any severe reactions post vaccine: Marland Kitchen Difficulty breathing  . Swelling of face and throat  . A fast heartbeat  . A bad rash all over body  . Dizziness and weakness   Immunizations Administered    Name Date Dose VIS Date Route   Moderna COVID-19 Vaccine 01/30/2020  5:22 PM 0.5 mL 12/17/2019 Intramuscular   Manufacturer: Moderna   Lot: [id number]   NDC: [address]-02

## 2020-05-04 IMAGING — CT CT CHEST WITH CONTRAST
2 of 4 series · 15 of 36 positions shown, 18 images · IV contrast (OMNIPAQUE)
Comparison: None.

CLINICAL DATA: Left axillary swelling and chest pain for several
months.

EXAM:
CT CHEST WITH CONTRAST
TECHNIQUE: Multidetector CT imaging of the chest was performed during
intravenous contrast administration.
CONTRAST:  75mL OMNIPAQUE IOHEXOL 300 MG/ML  SOLN

[Series 2: axial st · axial · 0.86mm/px · z∈[-95,+185]mm · 12 of 164 slices shown, 15 images]
[im 12/164  mediastinal]
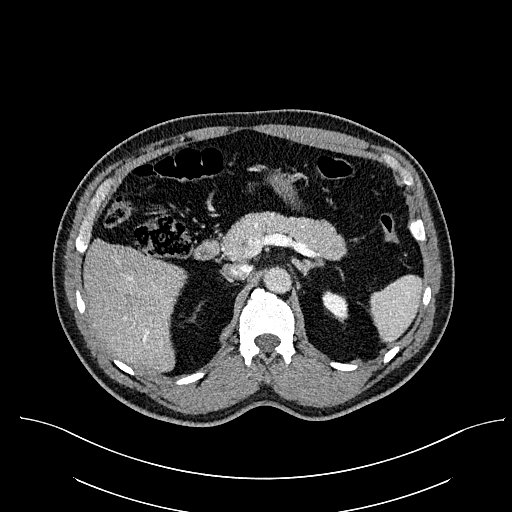
[im 12/164  lung]
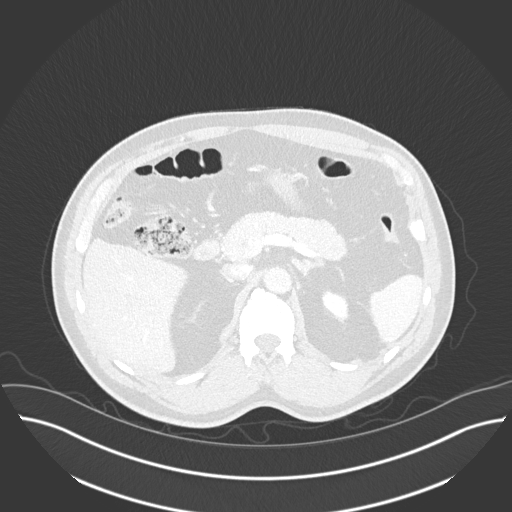
[im 24/164  lung]
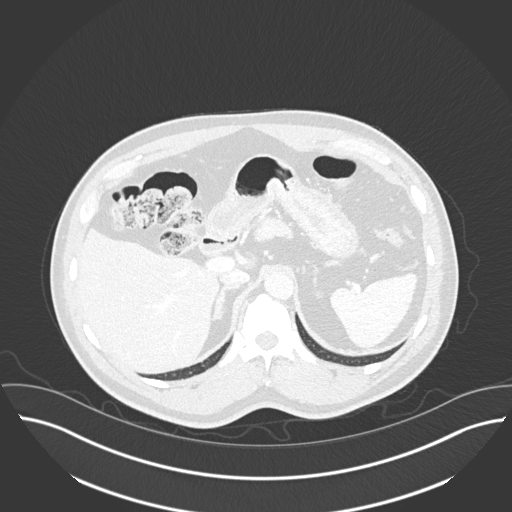
[im 35/164  lung]
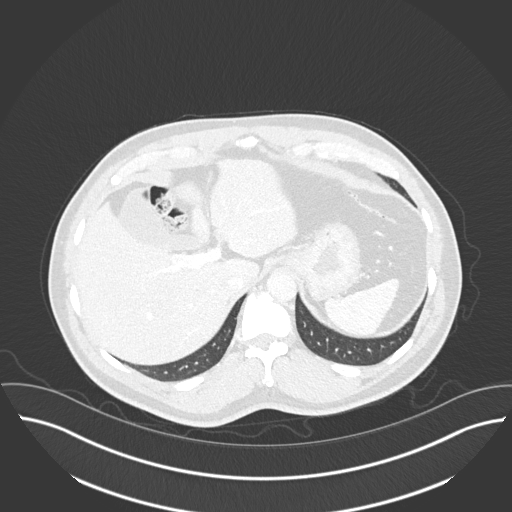
[im 47/164  lung]
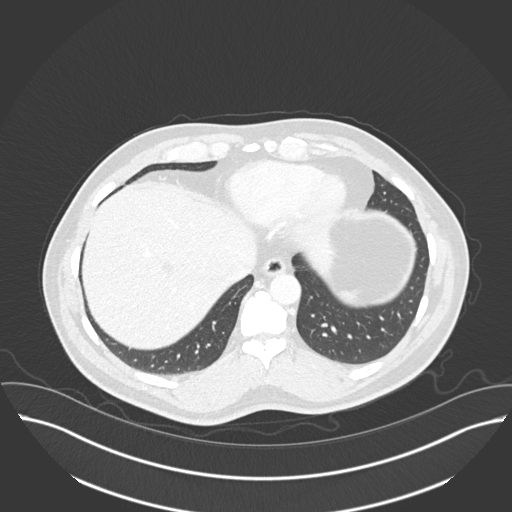
[im 59/164  mediastinal]
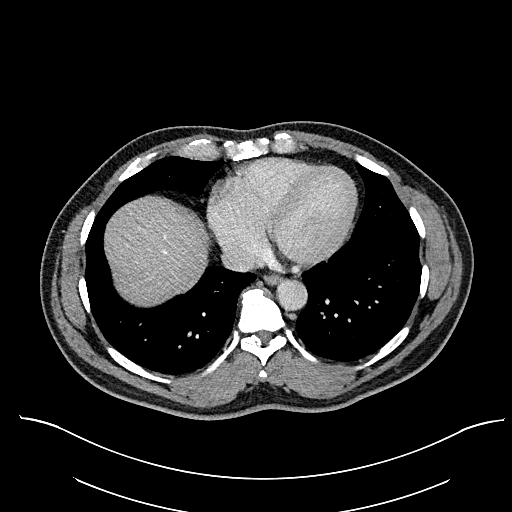
[im 59/164  lung]
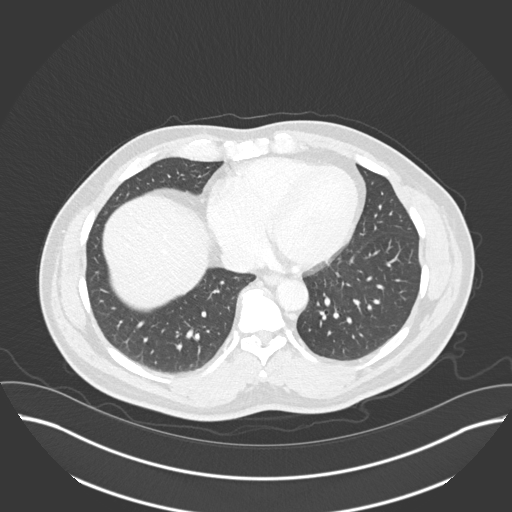
[im 70/164  lung]
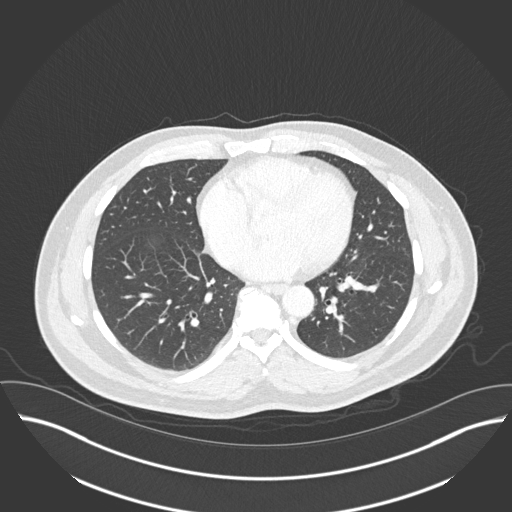
[im 94/164  lung]
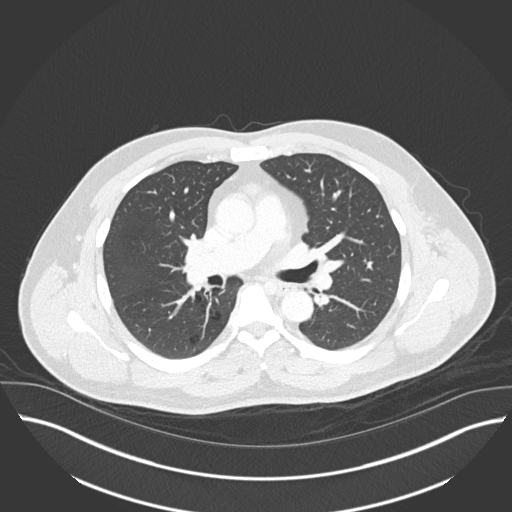
[im 105/164  lung]
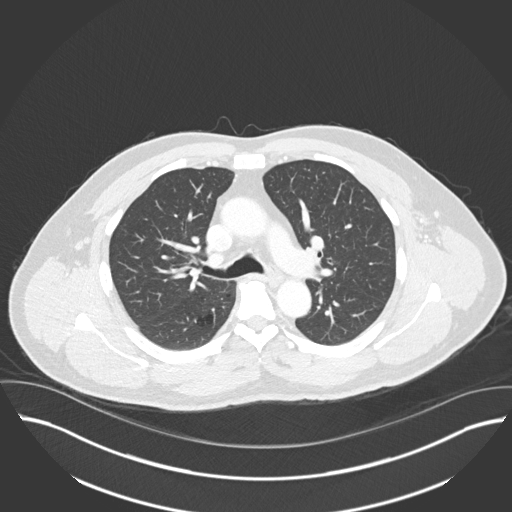
[im 117/164  mediastinal]
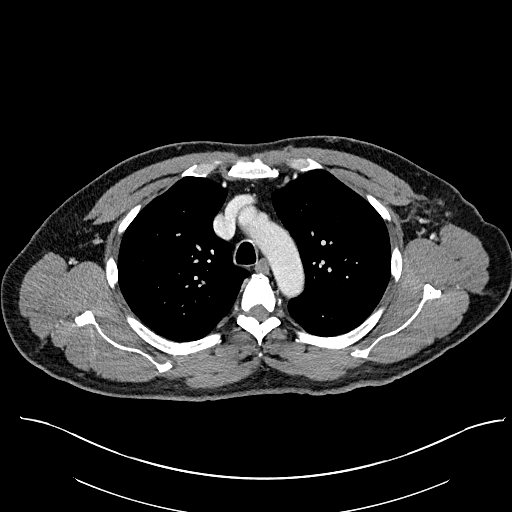
[im 117/164  lung]
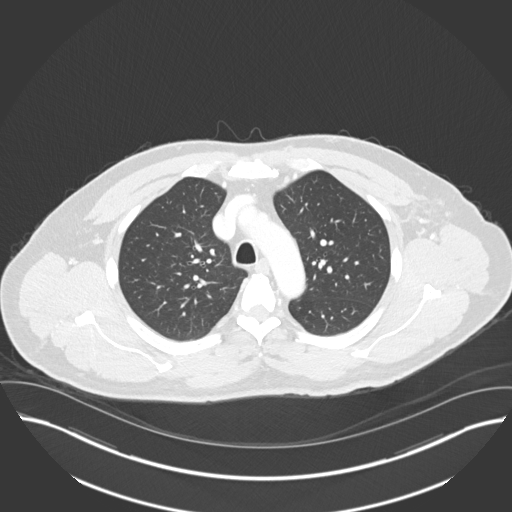
[im 129/164  lung]
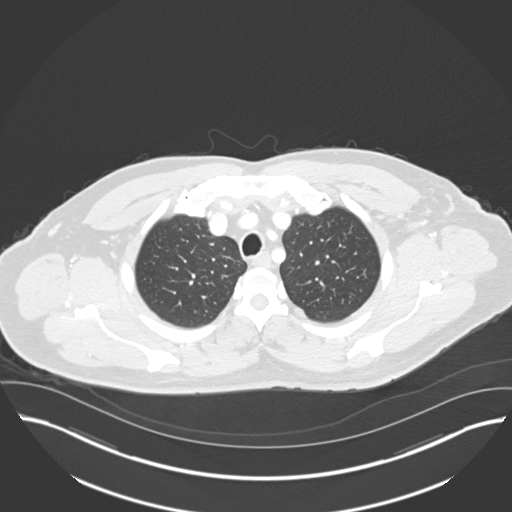
[im 140/164  lung]
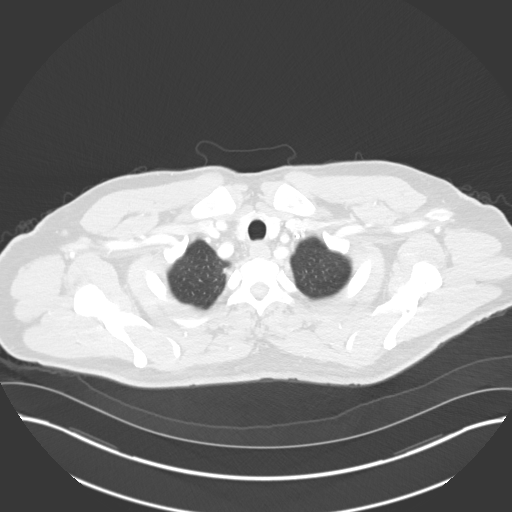
[im 152/164  lung]
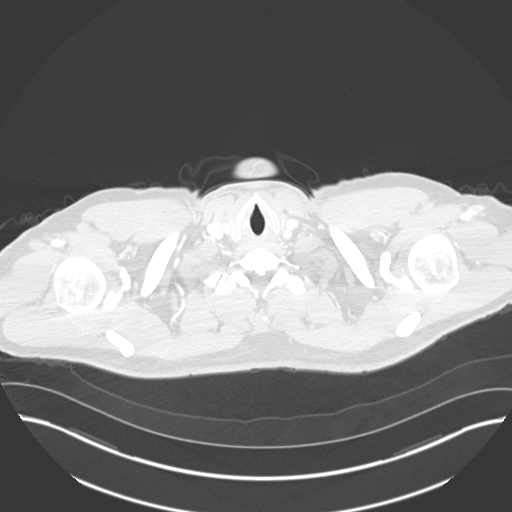

[Series 5: coronal · coronal · 0.65mm/px · 3 of 128 slices shown]
[im 26/128  lung]
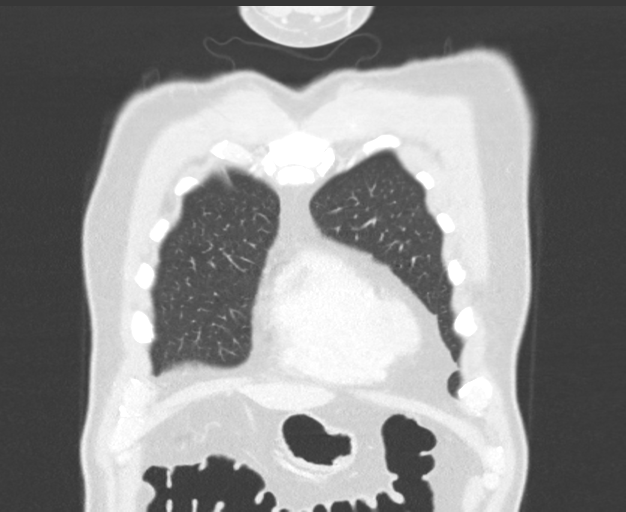
[im 51/128  lung]
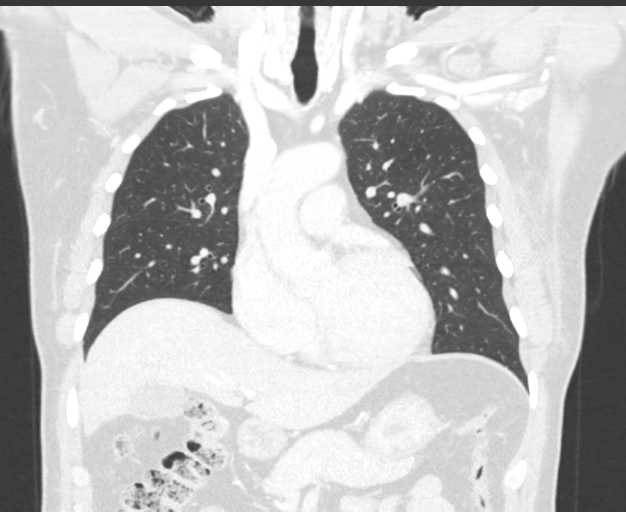
[im 77/128  lung]
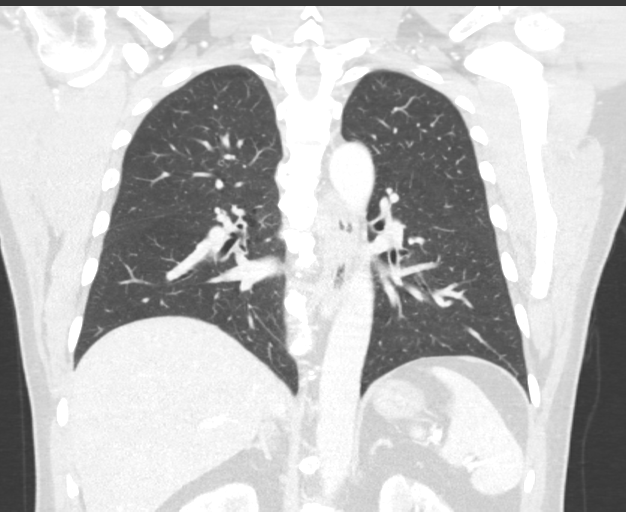

[15 of 36 positions shown; findings below may reference images not displayed]

FINDINGS: Cardiovascular: Normal heart size. No significant pericardial
effusion/thickening. Great vessels are normal in course and caliber.
No central pulmonary emboli.

Mediastinum/Nodes: No discrete thyroid nodules. Unremarkable
esophagus. No pathologically enlarged axillary lymph nodes.
Nonspecific asymmetric fat stranding throughout the left axilla. No
mediastinal or hilar lymphadenopathy.

Lungs/Pleura: No pneumothorax. No pleural effusion. Mild
centrilobular emphysema with diffuse bronchial wall thickening. No
acute consolidative airspace disease, lung masses or significant
pulmonary nodules.

Upper abdomen: No acute abnormality.

Musculoskeletal: No aggressive appearing focal osseous lesions.
Symmetric mild gynecomastia. No fluid collections. Moderate thoracic
spondylosis.
IMPRESSION: 1. Nonspecific left axillary fat stranding, presumably inflammatory,
without pathologically enlarged axillary lymph nodes or fluid
collections.
2. No active pulmonary disease.
3. Mild symmetric gynecomastia.

Aortic Atherosclerosis ([id number]).

## 2021-01-13 ENCOUNTER — Encounter: Payer: Self-pay | Admitting: Emergency Medicine

## 2021-01-13 ENCOUNTER — Ambulatory Visit (INDEPENDENT_AMBULATORY_CARE_PROVIDER_SITE_OTHER): Payer: Self-pay | Admitting: Emergency Medicine

## 2021-01-13 ENCOUNTER — Other Ambulatory Visit: Payer: Self-pay

## 2021-01-13 VITALS — BP 138/78 | HR 61 | Ht 64.0 in | Wt 176.0 lb

## 2021-01-13 DIAGNOSIS — Z7689 Persons encountering health services in other specified circumstances: Secondary | ICD-10-CM

## 2021-01-13 DIAGNOSIS — I10 Essential (primary) hypertension: Secondary | ICD-10-CM

## 2021-01-13 LAB — COMPREHENSIVE METABOLIC PANEL
ALT: 19 U/L (ref 0–53)
AST: 20 U/L (ref 0–37)
Albumin: 4.5 g/dL (ref 3.5–5.2)
Alkaline Phosphatase: 49 U/L (ref 39–117)
BUN: 8 mg/dL (ref 6–23)
CO2: 28 mEq/L (ref 19–32)
Calcium: 9.2 mg/dL (ref 8.4–10.5)
Chloride: 106 mEq/L (ref 96–112)
Creatinine, Ser: 0.88 mg/dL (ref 0.40–1.50)
GFR: 95.71 mL/min (ref >60.00)
Glucose, Bld: 107 mg/dL — ABNORMAL HIGH (ref 70–99)
Potassium: 3.9 mEq/L (ref 3.5–5.1)
Sodium: 139 mEq/L (ref 135–145)
Total Bilirubin: 0.8 mg/dL (ref 0.2–1.2)
Total Protein: 7.4 g/dL (ref 6.0–8.3)

## 2021-01-13 LAB — LIPID PANEL
Cholesterol: 132 mg/dL (ref 0–200)
HDL: 36.3 mg/dL — ABNORMAL LOW (ref >39.00)
LDL Cholesterol: 76 mg/dL (ref 0–99)
NonHDL: 95.5
Total CHOL/HDL Ratio: 4
Triglycerides: 99 mg/dL (ref 0.0–149.0)
VLDL: 19.8 mg/dL (ref 0.0–40.0)

## 2021-01-13 LAB — HEMOGLOBIN A1C: Hgb A1c MFr Bld: 6 % (ref 4.6–6.5)

## 2021-01-13 MED ORDER — LISINOPRIL 10 MG PO TABS: 10.0000 mg | ORAL_TABLET | Freq: Every day | ORAL | 3 refills | Status: DC

## 2021-01-13 NOTE — Progress Notes (Signed)
Barry Roy 55 y.o.   Chief Complaint  Patient presents with   New Patient (Initial Visit)   Annual Exam    Manage BP    HISTORY OF PRESENT ILLNESS: This is a 55 y.o. male first visit to this office here to establish care with me. Concerned about elevated blood pressure readings at home. No other complaints or medical concerns today. No other chronic medical history.  No chronic medications. Corporate investment banker.  Non-smoker.  HPI   Prior to Admission medications   Medication Sig Start Date End Date Taking? Authorizing Provider  cyclobenzaprine (FLEXERIL) 10 MG tablet Take 1 tablet (10 mg total) by mouth at bedtime. 05/30/18   Wurst, [place], PA-C  naproxen (NAPROSYN) 500 MG tablet Take 1 tablet (500 mg total) by mouth 2 (two) times daily. 05/30/18   Wurst, [place], PA-C    No Known Allergies  Patient Active Problem List   Diagnosis Date Noted   Closed displaced fracture of coronoid process of left ulna with routine healing 04/19/2016   Cervicalgia 04/19/2016    History reviewed. No pertinent past medical history.  Past Surgical History:  Procedure Laterality Date   FINGER SURGERY Left    fifth finger; work injury    Social History   Socioeconomic History   Marital status: Married    Spouse name: [name]   Number of children: 3   Years of education: 5th grade   Highest education level: Not on file  Occupational History   Occupation: Holiday representative    Comment: Scientist, water quality  Tobacco Use   Smoking status: Never   Smokeless tobacco: Never  Substance and Sexual Activity   Alcohol use: No    Alcohol/week: 0.0 standard drinks   Drug use: No   Sexual activity: Yes    Partners: Female  Other Topics Concern   Not on file  Social History Narrative   From [place], [place]. Moved to the [place] in 1985.   Lives with his wife, son and daughter-in-law.   2 sons live on-campus at [employer] and [address].   Social Determinants of Health   Financial Resource Strain: Not  on file  Food Insecurity: Not on file  Transportation Needs: Not on file  Physical Activity: Not on file  Stress: Not on file  Social Connections: Not on file  Intimate Partner Violence: Not on file    Family History  Problem Relation Age of Onset   Breast cancer Mother    Arthritis Sister        knee     Review of Systems  Constitutional: Negative.  Negative for chills and fever.  HENT: Negative.  Negative for congestion and sore throat.   Respiratory: Negative.  Negative for cough and shortness of breath.   Cardiovascular: Negative.  Negative for chest pain and palpitations.  Gastrointestinal: Negative.  Negative for abdominal pain, diarrhea, nausea and vomiting.  Genitourinary: Negative.   Musculoskeletal: Negative.   Skin: Negative.  Negative for rash.  Neurological: Negative.  Negative for dizziness and headaches.  All other systems reviewed and are negative. Today's Vitals   01/13/21 0906  BP: 138/78  Pulse: 61  SpO2: 97%  Weight: 176 lb (79.8 kg)  Height: 5\' 4"  (1.626 m)   Body mass index is 30.21 kg/m. Wt Readings from Last 3 Encounters:  01/13/21 176 lb (79.8 kg)  05/30/18 182 lb (82.6 kg)  03/28/16 184 lb 6.4 oz (83.6 kg)     Physical Exam Vitals reviewed.  Constitutional:  Appearance: Normal appearance.  HENT:     Head: Normocephalic.     Right Ear: Tympanic membrane, ear canal and external ear normal.     Left Ear: Tympanic membrane, ear canal and external ear normal.     Mouth/Throat:     Mouth: Mucous membranes are moist.     Pharynx: Oropharynx is clear.  Eyes:     Extraocular Movements: Extraocular movements intact.     Conjunctiva/sclera: Conjunctivae normal.     Pupils: Pupils are equal, round, and reactive to light.  Cardiovascular:     Rate and Rhythm: Normal rate and regular rhythm.     Pulses: Normal pulses.     Heart sounds: Normal heart sounds.  Pulmonary:     Effort: Pulmonary effort is normal.     Breath sounds: Normal  breath sounds.  Abdominal:     General: Bowel sounds are normal. There is no distension.     Palpations: Abdomen is soft.     Tenderness: There is no abdominal tenderness.  Musculoskeletal:        General: Normal range of motion.     Cervical back: Normal range of motion and neck supple. No tenderness.     Right lower leg: No edema.     Left lower leg: No edema.  Lymphadenopathy:     Cervical: No cervical adenopathy.  Skin:    General: Skin is warm and dry.     Capillary Refill: Capillary refill takes less than 2 seconds.  Neurological:     General: No focal deficit present.     Mental Status: He is alert and oriented to person, place, and time.  Psychiatric:        Mood and Affect: Mood normal.        Behavior: Behavior normal.     ASSESSMENT & PLAN: Problem List Items Addressed This Visit       Cardiovascular and Mediastinum   Essential hypertension - Primary    Elevated blood pressure readings at home and in the office. Will start lisinopril 10 mg daily.  Blood work done today. Dietary approaches to stop hypertension discussed with patient. Follow-up in 3 months.      Relevant Medications   lisinopril (ZESTRIL) 10 MG tablet   Other Relevant Orders   Comprehensive metabolic panel (Completed)   Lipid panel (Completed)   Hemoglobin A1c (Completed)   Other Visit Diagnoses     Encounter to establish care          Patient Instructions  Hipertensin en los adultos Hypertension, Adult El trmino hipertensin es otra forma de denominar a la presin arterial elevada. La presin arterial elevada fuerza al corazn a trabajar ms para bombear la sangre. Esto puede causar problemas con el paso del [place]. Una lectura de presin arterial est compuesta por 2 nmeros. Hay un nmero superior (sistlico) sobre un nmero inferior (diastlico). Lo ideal es tener la presin arterial por debajo de 120/80. Las elecciones saludables pueden ayudar a Personal assistant presin arterial, o tal vez  necesite medicamentos para bajarla. Cules son las causas? Se desconoce la causa de esta afeccin. Algunas afecciones pueden estar relacionadas con la presin arterial alta. Qu incrementa el riesgo? Fumar. Tener diabetes mellitus tipo 2, colesterol alto, o ambos. No hacer la cantidad suficiente de actividad fsica o ejercicio. Tener sobrepeso. Consumir mucha grasa, azcar, caloras o sal (sodio) en su dieta. Beber alcohol en exceso. Tener una enfermedad renal a largo plazo (crnica). Tener antecedentes familiares de presin arterial alta.  Edad. Los riesgos aumentan con la edad. Raza. El riesgo es mayor para las Statistician. Sexo. Antes de los 45 aos, los hombres corren ms [employer]. Despus de los 65 aos, las mujeres corren ms [employer]. Tener apnea obstructiva del sueo. Estrs. Cules son los signos o los sntomas? Es posible que la presin arterial alta puede no cause sntomas. La presin arterial muy alta (crisis hipertensiva) puede provocar: Dolor de cabeza. Sensaciones de preocupacin o nerviosismo (ansiedad). Falta de aire. Hemorragia nasal. Sensacin de malestar en el estmago (nuseas). Vmitos. Cambios en la forma de ver. Dolor muy intenso en el pecho. Convulsiones. Cmo se trata? Esta afeccin se trata haciendo cambios saludables en el estilo de vida, por ejemplo: Consumir alimentos saludables. Hacer ms ejercicio. Beber menos alcohol. El mdico puede recetarle medicamentos si los cambios en el estilo de vida no son suficientes para Museum/gallery curator la presin arterial y si: El nmero de arriba est por encima de 130. El nmero de abajo est por encima de 80. Su presin arterial personal ideal puede variar. Siga estas instrucciones en su casa: Comida y bebida  Si se lo dicen, siga el plan de alimentacin de DASH (Dietary Approaches to Stop Hypertension, Maneras de alimentarse para detener la hipertensin). Para seguir  este plan: Llene la mitad del plato de cada comida con frutas y verduras. Llene un cuarto del plato de cada comida con cereales integrales. Los cereales integrales incluyen pasta integral, arroz integral y pan integral. Coma y beba productos lcteos con bajo contenido de grasa, como leche descremada o yogur bajo en grasas. Llene un cuarto del plato de cada comida con protenas bajas en grasa (magras). Las protenas bajas en grasa incluyen pescado, pollo sin piel, huevos, frijoles y tofu. Evite consumir carne grasa, carne curada y procesada, o pollo con piel. Evite consumir alimentos prehechos o procesados. Consuma menos de 1500 mg de sal por da. No beba alcohol si: El mdico le indica que no lo haga. Est embarazada, puede estar embarazada o est tratando de [place]. Si bebe alcohol: Limite la cantidad que bebe a lo siguiente: De 0 a 1 medida por da para las mujeres. De 0 a 2 medidas por da para los hombres. Est atento a la cantidad de alcohol que hay en las bebidas que toma. En los [address], una medida equivale a una botella de cerveza de 12 oz (355 ml), un vaso de vino de 5 oz (148 ml) o un vaso de una bebida alcohlica de alta graduacin de 1 oz (44 ml). Estilo de vida  Trabaje con su mdico para mantenerse en un peso saludable o para perder peso. Pregntele a su mdico cul es el peso recomendable para usted. Haga al menos 30 minutos de ejercicio la [employer] de la [place]. Estos pueden incluir caminar, nadar o andar en bicicleta. Realice al menos 30 minutos de ejercicio que fortalezca sus msculos (ejercicios de resistencia) al menos 3 das a la [place]. Estos pueden incluir levantar pesas o hacer Pilates. No consuma ningn producto que contenga nicotina o tabaco, como cigarrillos, cigarrillos electrnicos y tabaco de Theatre manager. Si necesita ayuda para dejar de fumar, consulte al [employer]. Controle su presin arterial en su casa tal como le indic el  mdico. Concurra a todas las visitas de seguimiento como se lo haya indicado el mdico. Esto es importante. Medicamentos [employer] de venta libre y los recetados solamente como se lo haya indicado el mdico. Siga cuidadosamente  las indicaciones. No omita las dosis de medicamentos para la presin arterial. Los medicamentos pierden eficacia si omite dosis. El hecho de omitir las dosis tambin [place] el riesgo de otros problemas. Pregntele a su mdico a qu efectos secundarios o reacciones a los Museum/gallery curator. Comunquese con un mdico si: Piensa que tiene [place] reaccin a los medicamentos que est tomando. Tiene dolores de cabeza frecuentes (recurrentes). Se siente mareado. Tiene hinchazn en los tobillos. Tiene problemas de visin. Solicite ayuda inmediatamente si: Siente un dolor de cabeza muy intenso. Empieza a sentirse desorientado (confundido). Se siente dbil o adormecido. Siente que va a desmayarse. Tiene un dolor muy intenso en las siguientes zonas: Pecho. Vientre (abdomen). Vomita ms de una vez. Tiene dificultad para respirar. Resumen El trmino hipertensin es otra forma de denominar a la presin arterial elevada. La presin arterial elevada fuerza al corazn a trabajar ms para bombear la sangre. Para la [employer], una presin arterial normal es menor que 120/80. Las decisiones saludables pueden ayudarle a disminuir su presin arterial. Si no puede bajar su presin arterial mediante decisiones saludables, es posible que deba tomar medicamentos. Esta informacin no tiene Theme park manager el consejo del mdico. Asegrese de hacerle al mdico cualquier pregunta que tenga. Document Revised: 11/29/2017 Document Reviewed: 11/29/2017 Elsevier Patient Education  2022 Elsevier Inc.    Edwina Barth, MD [facility]

## 2021-01-13 NOTE — Assessment & Plan Note (Signed)
Elevated blood pressure readings at home and in the office. Will start lisinopril 10 mg daily.  Blood work done today. Dietary approaches to stop hypertension discussed with patient. Follow-up in 3 months.

## 2021-01-13 NOTE — Patient Instructions (Signed)
Hipertensi?n en los adultos ?Hypertension, Adult ?El t?rmino hipertensi?n es otra forma de denominar a la presi?n arterial elevada. La presi?n arterial elevada fuerza al coraz?n a trabajar m?s para bombear la sangre. Esto puede causar problemas con el paso del tiempo. ?Una lectura de presi?n arterial est? compuesta por 2 n?meros. Hay un n?mero superior (sist?lico) sobre un n?mero inferior (diast?lico). Lo ideal es tener la presi?n arterial por debajo de 120/80. Las elecciones saludables pueden ayudar a bajar la presi?n arterial, o tal vez necesite medicamentos para bajarla. ??Cu?les son las causas? ?Se desconoce la causa de esta afecci?n. Algunas afecciones pueden estar relacionadas con la presi?n arterial alta. ??Qu? incrementa el riesgo? ?Fumar. ?Tener diabetes mellitus tipo 2, colesterol alto, o ambos. ?No hacer la cantidad suficiente de actividad f?sica o ejercicio. ?Tener sobrepeso. ?Consumir mucha grasa, az?car, calor?as o sal (sodio) en su dieta. ?Beber alcohol en exceso. ?Tener una enfermedad renal a largo plazo (cr?nica). ?Tener antecedentes familiares de presi?n arterial alta. ?Edad. Los riesgos aumentan con la edad. ?Raza. El riesgo es mayor para las personas afroamericanas. ?Sexo. Antes de los 45 a?os, los hombres corren m?s riesgo que las mujeres. Despu?s de los 65 a?os, las mujeres corren m?s riesgo que los hombres. ?Tener apnea obstructiva del sue?o. ?Estr?s. ??Cu?les son los signos o los s?ntomas? ?Es posible que la presi?n arterial alta puede no cause s?ntomas. La presi?n arterial muy alta (crisis hipertensiva) puede provocar: ?Dolor de cabeza. ?Sensaciones de preocupaci?n o nerviosismo (ansiedad). ?Falta de aire. ?Hemorragia nasal. ?Sensaci?n de malestar en el est?mago (n?useas). ?V?mitos. ?Cambios en la forma de ver. ?Dolor muy intenso en el pecho. ?Convulsiones. ??C?mo se trata? ?Esta afecci?n se trata haciendo cambios saludables en el estilo de vida, por ejemplo: ?Consumir alimentos  saludables. ?Hacer m?s ejercicio. ?Beber menos alcohol. ?El m?dico puede recetarle medicamentos si los cambios en el estilo de vida no son suficientes para lograr controlar la presi?n arterial y si: ?El n?mero de arriba est? por encima de 130. ?El n?mero de abajo est? por encima de 80. ?Su presi?n arterial personal ideal puede variar. ?Siga estas instrucciones en su casa: ?Comida y bebida ? ?Si se lo dicen, siga el plan de alimentaci?n de DASH (Dietary Approaches to Stop Hypertension, Maneras de alimentarse para detener la hipertensi?n). Para seguir este plan: ?Llene la mitad del plato de cada comida con frutas y verduras. ?Llene un cuarto del plato de cada comida con cereales integrales. Los cereales integrales incluyen pasta integral, arroz integral y pan integral. ?Coma y beba productos l?cteos con bajo contenido de grasa, como leche descremada o yogur bajo en grasas. ?Llene un cuarto del plato de cada comida con prote?nas bajas en grasa (magras). Las prote?nas bajas en grasa incluyen pescado, pollo sin piel, huevos, frijoles y tofu. ?Evite consumir carne grasa, carne curada y procesada, o pollo con piel. ?Evite consumir alimentos prehechos o procesados. ?Consuma menos de 1500 mg de sal por d?a. ?No beba alcohol si: ?El m?dico le indica que no lo haga. ?Est? embarazada, puede estar embarazada o est? tratando de quedar embarazada. ?Si bebe alcohol: ?Limite la cantidad que bebe a lo siguiente: ?De 0 a 1 medida por d?a para las mujeres. ?De 0 a 2 medidas por d?a para los hombres. ?Est? atento a la cantidad de alcohol que hay en las bebidas que toma. En los [place], una medida equivale a una botella de cerveza de 12 oz (355 ml), un vaso de vino de 5 oz (148 ml) o un vaso de una bebida alcoh?lica de   alta graduaci?n de 1? oz (44 ml). ?Estilo de vida ? ?Trabaje con su m?dico para mantenerse en un peso saludable o para perder peso. Preg?ntele a su m?dico cu?l es el peso recomendable para usted. ?Haga al menos 30  minutos de ejercicio la mayor?a de los d?as de la semana. Estos pueden incluir caminar, nadar o andar en bicicleta. ?Realice al menos 30 minutos de ejercicio que fortalezca sus m?sculos (ejercicios de resistencia) al menos 3 d?as a la semana. Estos pueden incluir levantar pesas o hacer Pilates. ?No consuma ning?n producto que contenga nicotina o tabaco, como cigarrillos, cigarrillos electr?nicos y tabaco de mascar. Si necesita ayuda para dejar de fumar, consulte al m?dico. ?Controle su presi?n arterial en su casa tal como le indic? el m?dico. ?Concurra a todas las visitas de seguimiento como se lo haya indicado el m?dico. Esto es importante. ?Medicamentos ?Tome los medicamentos de venta libre y los recetados solamente como se lo haya indicado el m?dico. Siga cuidadosamente las indicaciones. ?No omita las dosis de medicamentos para la presi?n arterial. Los medicamentos pierden eficacia si omite dosis. El hecho de omitir las dosis tambi?n aumenta el riesgo de otros problemas. ?Preg?ntele a su m?dico a qu? efectos secundarios o reacciones a los medicamentos debe prestar atenci?n. ?Comun?quese con un m?dico si: ?Piensa que tiene una reacci?n a los medicamentos que est? tomando. ?Tiene dolores de cabeza frecuentes (recurrentes). ?Se siente mareado. ?Tiene hinchaz?n en los tobillos. ?Tiene problemas de visi?n. ?Solicite ayuda inmediatamente si: ?Siente un dolor de cabeza muy intenso. ?Empieza a sentirse desorientado (confundido). ?Se siente d?bil o adormecido. ?Siente que va a desmayarse. ?Tiene un dolor muy intenso en las siguientes zonas: ?Pecho. ?Vientre (abdomen). ?Vomita m?s de una vez. ?Tiene dificultad para respirar. ?Resumen ?El t?rmino hipertensi?n es otra forma de denominar a la presi?n arterial elevada. ?La presi?n arterial elevada fuerza al coraz?n a trabajar m?s para bombear la sangre. ?Para la mayor?a de las personas, una presi?n arterial normal es menor que 120/80. ?Las decisiones saludables pueden ayudarle  a disminuir su presi?n arterial. Si no puede bajar su presi?n arterial mediante decisiones saludables, es posible que deba tomar medicamentos. ?Esta informaci?n no tiene como fin reemplazar el consejo del m?dico. Aseg?rese de hacerle al m?dico cualquier pregunta que tenga. ?Document Revised: 11/29/2017 Document Reviewed: 11/29/2017 ?Elsevier Patient Education ? 2022 Elsevier Inc. ? ?

## 2021-01-16 ENCOUNTER — Encounter: Payer: Self-pay | Admitting: Emergency Medicine

## 2021-04-04 ENCOUNTER — Ambulatory Visit (INDEPENDENT_AMBULATORY_CARE_PROVIDER_SITE_OTHER): Payer: Self-pay | Admitting: Emergency Medicine

## 2021-04-04 ENCOUNTER — Encounter: Payer: Self-pay | Admitting: Emergency Medicine

## 2021-04-04 ENCOUNTER — Other Ambulatory Visit: Payer: Self-pay

## 2021-04-04 VITALS — BP 124/70 | HR 60 | Temp 98.1°F | Ht 64.0 in | Wt 166.0 lb

## 2021-04-04 DIAGNOSIS — Z1211 Encounter for screening for malignant neoplasm of colon: Secondary | ICD-10-CM

## 2021-04-04 DIAGNOSIS — R7303 Prediabetes: Secondary | ICD-10-CM

## 2021-04-04 DIAGNOSIS — N5089 Other specified disorders of the male genital organs: Secondary | ICD-10-CM | POA: Insufficient documentation

## 2021-04-04 DIAGNOSIS — I10 Essential (primary) hypertension: Secondary | ICD-10-CM

## 2021-04-04 MED ORDER — LISINOPRIL 10 MG PO TABS: 10.0000 mg | ORAL_TABLET | Freq: Every day | ORAL | 3 refills | Status: DC

## 2021-04-04 NOTE — Assessment & Plan Note (Signed)
Well-controlled hypertension.  Symptomatic when low at home. Advised to stop lisinopril and continue recording blood pressure readings at home daily for the next several weeks.  Restart medication if numbers are high.

## 2021-04-04 NOTE — Patient Instructions (Signed)
Mantenimiento de la salud en los hombres °Health Maintenance, Male °Adoptar un estilo de vida saludable y recibir atención preventiva son importantes para promover la salud y el bienestar. Consulte al médico sobre: °El esquema adecuado para hacerse pruebas y exámenes periódicos. °Cosas que puede hacer por su cuenta para prevenir enfermedades y mantenerse sano. °¿Qué debo saber sobre la dieta, el peso y el ejercicio? °Consuma una dieta saludable ° °Consuma una dieta que incluya muchas verduras, frutas, productos lácteos con bajo contenido de grasa y proteínas magras. °No consuma muchos alimentos ricos en grasas sólidas, azúcares agregados o sodio. °Mantenga un peso saludable °El índice de masa muscular (IMC) es una medida que puede utilizarse para identificar posibles problemas de peso. Proporciona una estimación de la grasa corporal basándose en el peso y la altura. Su médico puede ayudarle a determinar su IMC y a lograr o mantener un peso saludable. °Haga ejercicio con regularidad °Haga ejercicio con regularidad. Esta es una de las prácticas más importantes que puede hacer por su salud. La mayoría de los adultos deben seguir estas pautas: °Realizar, al menos, 150 minutos de actividad física por semana. El ejercicio debe aumentar la frecuencia cardíaca y hacerlo transpirar (ejercicio de intensidad moderada). °Hacer ejercicios de fortalecimiento por lo menos dos veces por semana. Agregue esto a su plan de ejercicio de intensidad moderada. °Pase menos tiempo sentado. Incluso la actividad física ligera puede ser beneficiosa. °Controle sus niveles de colesterol y lípidos en la sangre °Comience a realizarse análisis de lípidos y colesterol en la sangre a los 20 años y luego repítalos cada 5 años. °Es posible que necesite controlar los niveles de colesterol con mayor frecuencia si: °Sus niveles de lípidos y colesterol son altos. °Es mayor de 40 años. °Presenta un alto riesgo de padecer enfermedades cardíacas. °¿Qué debo  saber sobre las pruebas de detección del cáncer? °Muchos tipos de cáncer pueden detectarse de manera temprana y, a menudo, pueden prevenirse. Según su historia clínica y sus antecedentes familiares, es posible que deba realizarse pruebas de detección del cáncer en diferentes edades. Esto puede incluir pruebas de detección de lo siguiente: °Cáncer colorrectal. °Cáncer de próstata. °Cáncer de piel. °Cáncer de pulmón. °¿Qué debo saber sobre la enfermedad cardíaca, la diabetes y la hipertensión arterial? °Presión arterial y enfermedad cardíaca °La hipertensión arterial causa enfermedades cardíacas y aumenta el riesgo de accidente cerebrovascular. Es más probable que esto se manifieste en las personas que tienen lecturas de presión arterial alta o tienen sobrepeso. °Hable con el médico sobre sus valores de presión arterial deseados. °Hágase controlar la presión arterial: °Cada 3 a 5 años si tiene entre 18 y 39 años. °Todos los años si es mayor de 40 años. °Si tiene entre 65 y 75 años y es fumador o solía fumar, pregúntele al médico si debe realizarse una prueba de detección de aneurisma aórtico abdominal (AAA) por única vez. °Diabetes °Realícese exámenes de detección de la diabetes con regularidad. Este análisis revisa el nivel de azúcar en la sangre en ayunas. Hágase las pruebas de detección: °Cada tres años después de los 45 años de edad si tiene un peso normal y un bajo riesgo de padecer diabetes. °Con más frecuencia y a partir de una edad inferior si tiene sobrepeso o un alto riesgo de padecer diabetes. °¿Qué debo saber sobre la prevención de infecciones? °Hepatitis B °Si tiene un riesgo más alto de contraer hepatitis B, debe someterse a un examen de detección de este virus. Hable con el médico para averiguar si tiene riesgo de   contraer la infección por hepatitis B. °Hepatitis C °Se recomienda un análisis de sangre para: °Todos los que nacieron entre 1945 y 1965. °Todas las personas que tengan un riesgo de haber  contraído hepatitis C. °Enfermedades de transmisión sexual (ETS) °Debe realizarse pruebas de detección de ITS todos los años, incluidas la gonorrea y la clamidia, si: °Es sexualmente activo y es menor de 24 años. °Es mayor de 24 años, y el médico le informa que corre riesgo de tener este tipo de infecciones. °La actividad sexual ha cambiado desde que le hicieron la última prueba de detección y tiene un riesgo mayor de tener clamidia o gonorrea. Pregúntele al médico si usted tiene riesgo. °Pregúntele al médico si usted tiene un alto riesgo de contraer VIH. El médico también puede recomendarle un medicamento recetado para ayudar a evitar la infección por el VIH. Si elige tomar medicamentos para prevenir el VIH, primero debe hacerse los análisis de detección del VIH. Luego debe hacerse análisis cada 3 meses mientras esté tomando los medicamentos. °Siga estas indicaciones en su casa: °Consumo de alcohol °No beba alcohol si el médico se lo prohíbe. °Si bebe alcohol: °Limite la cantidad que consume de 0 a 2 bebidas por día. °Sepa cuánta cantidad de alcohol hay en las bebidas que toma. En [place], una medida equivale a una botella de cerveza de 12 oz (355 ml), un vaso de vino de 5 oz (148 ml) o un vaso de una bebida alcohólica de alta graduación de 1½ oz (44 ml). °Estilo de vida °No consuma ningún producto que contenga nicotina o tabaco. Estos productos incluyen cigarrillos, tabaco para mascar y aparatos de vapeo, como los cigarrillos electrónicos. Si necesita ayuda para dejar de consumir estos productos, consulte al médico. °No consuma drogas. °No comparta agujas. °Solicite ayuda a su médico si necesita apoyo o información para abandonar las drogas. °Indicaciones generales °Realícese los estudios de rutina de la salud, dentales y de la vista. °Manténgase al día con las vacunas. °Infórmele a su médico si: °Se siente deprimido con frecuencia. °Alguna vez ha sido víctima de maltrato o no se siente seguro en su  casa. °Resumen °Adoptar un estilo de vida saludable y recibir atención preventiva son importantes para promover la salud y el bienestar. °Siga las instrucciones del médico acerca de una dieta saludable, el ejercicio y la realización de pruebas o exámenes para detectar enfermedades. °Siga las instrucciones del médico con respecto al control del colesterol y la presión arterial. °Esta información no tiene como fin reemplazar el consejo del médico. Asegúrese de hacerle al médico cualquier pregunta que tenga. °Document Revised: 07/21/2020 Document Reviewed: 07/21/2020 °Elsevier Patient Education © 2022 Elsevier Inc. ° °

## 2021-04-04 NOTE — Assessment & Plan Note (Addendum)
No red flag signs or symptoms.  Symptoms for 2 months.  No urinary symptoms.  No signs of hernia on examination.  Needs diagnostic testicular ultrasound. No family history of testicular cancer.

## 2021-04-04 NOTE — Progress Notes (Signed)
Barry Roy 56 y.o.   Chief Complaint  Patient presents with   Hypertension    F/u    HISTORY OF PRESENT ILLNESS: This is a 56 y.o. male with history of hypertension on lisinopril 10 mg daily here for follow-up. Here today with son. Blood work from 3 months ago within normal limits.  Normal renal function and normal electrolytes. Blood pressure readings low normal at home. Also complaining of left testicular swelling for 2 months with minimal discomfort. No family history of prostate or colon cancer.  Needs referral for colonoscopy. BP Readings from Last 3 Encounters:  01/13/21 138/78  05/30/18 115/73  03/28/16 128/78   Wt Readings from Last 3 Encounters:  04/04/21 166 lb (75.3 kg)  01/13/21 176 lb (79.8 kg)  05/30/18 182 lb (82.6 kg)   Today's Vitals   04/04/21 0826  BP: 124/70  Pulse: 60  Temp: 98.1 F (36.7 C)  TempSrc: Oral  SpO2: 97%  Weight: 166 lb (75.3 kg)  Height: 5\' 4"  (1.626 m)   Body mass index is 28.49 kg/m.   Hypertension Pertinent negatives include no chest pain, headaches, palpitations or shortness of breath.    Prior to Admission medications   Medication Sig Start Date End Date Taking? Authorizing Provider  lisinopril (ZESTRIL) 10 MG tablet Take 1 tablet (10 mg total) by mouth daily. 01/13/21   Georgina Quint, MD    No Known Allergies  Patient Active Problem List   Diagnosis Date Noted   Essential hypertension 01/13/2021   Cervicalgia 04/19/2016    No past medical history on file.  Past Surgical History:  Procedure Laterality Date   FINGER SURGERY Left    fifth finger; work injury    Social History   Socioeconomic History   Marital status: Married    Spouse name: Barry Roy   Number of children: 3   Years of education: 5th grade   Highest education level: Not on file  Occupational History   Occupation: Holiday representative    Comment: Scientist, water quality  Tobacco Use   Smoking status: Never   Smokeless tobacco: Never   Substance and Sexual Activity   Alcohol use: No    Alcohol/week: 0.0 standard drinks   Drug use: No   Sexual activity: Yes    Partners: Female  Other Topics Concern   Not on file  Social History Narrative   From [place], [place]. Moved to the [place] in 1985.   Lives with his wife, son and daughter-in-law.   2 sons live on-campus at [facility] and [address].   Social Determinants of Health   Financial Resource Strain: Not on file  Food Insecurity: Not on file  Transportation Needs: Not on file  Physical Activity: Not on file  Stress: Not on file  Social Connections: Not on file  Intimate Partner Violence: Not on file    Family History  Problem Relation Age of Onset   Breast cancer Mother    Arthritis Sister        knee     Review of Systems  Constitutional: Negative.  Negative for chills and fever.  HENT: Negative.  Negative for congestion and sore throat.   Respiratory: Negative.  Negative for cough and shortness of breath.   Cardiovascular: Negative.  Negative for chest pain and palpitations.  Gastrointestinal: Negative.  Negative for abdominal pain, diarrhea, nausea and vomiting.  Genitourinary: Negative.  Negative for dysuria and hematuria.       Left testicular discomfort  Musculoskeletal: Negative.  Skin: Negative.  Negative for rash.  Neurological: Negative.  Negative for dizziness and headaches.  All other systems reviewed and are negative. Today's Vitals   04/04/21 0826  BP: 124/70  Pulse: 60  Temp: 98.1 F (36.7 C)  TempSrc: Oral  SpO2: 97%  Weight: 166 lb (75.3 kg)  Height: 5\' 4"  (1.626 m)   Body mass index is 28.49 kg/m.   Physical Exam Vitals reviewed.  Constitutional:      Appearance: Normal appearance.  HENT:     Head: Normocephalic.  Eyes:     Extraocular Movements: Extraocular movements intact.     Pupils: Pupils are equal, round, and reactive to light.  Cardiovascular:     Rate and Rhythm: Normal rate and regular rhythm.     Pulses: Normal  pulses.     Heart sounds: Normal heart sounds.  Pulmonary:     Effort: Pulmonary effort is normal.     Breath sounds: Normal breath sounds.  Abdominal:     Palpations: Abdomen is soft.     Tenderness: There is no abdominal tenderness.     Hernia: There is no hernia in the left inguinal area or right inguinal area.  Genitourinary:    Penis: Uncircumcised.      Testes:        Right: Mass, tenderness or swelling not present.        Left: Swelling present.     Epididymis:     Right: Normal.     Left: Normal.  Musculoskeletal:     Cervical back: Normal range of motion and neck supple. No tenderness.  Lymphadenopathy:     Cervical: No cervical adenopathy.     Lower Body: No right inguinal adenopathy. No left inguinal adenopathy.  Neurological:     Mental Status: He is alert.     ASSESSMENT & PLAN: Problem List Items Addressed This Visit       Cardiovascular and Mediastinum   Essential hypertension - Primary    Well-controlled hypertension.  Symptomatic when low at home. Advised to stop lisinopril and continue recording blood pressure readings at home daily for the next several weeks.  Restart medication if numbers are high.      Relevant Medications   lisinopril (ZESTRIL) 10 MG tablet     Other   Testicular swelling, left    No red flag signs or symptoms.  Symptoms for 2 months.  No urinary symptoms.  No signs of hernia on examination.  Needs diagnostic testicular ultrasound. No family history of testicular cancer.      Relevant Orders   US Scrotum   Prediabetes    Diet and nutrition discussed.  Advised to decrease amount of daily carbohydrate intake.      Other Visit Diagnoses     Colon cancer screening       Relevant Orders   Ambulatory referral to Gastroenterology      Patient Instructions  Mantenimiento de la salud en los hombres Health Maintenance, Male Adoptar un estilo de vida saludable y recibir atencin preventiva son importantes para promover la  salud y Musician. Consulte al mdico sobre: El esquema adecuado para hacerse pruebas y exmenes peridicos. Cosas que puede hacer por su cuenta para prevenir enfermedades y [place] sano. Qu debo saber sobre la dieta, el peso y el ejercicio? Consuma una dieta saludable  Consuma una dieta que incluya muchas verduras, frutas, productos lcteos con bajo contenido de [place] y Advertising account planner. No consuma muchos alimentos ricos en grasas slidas, azcares  agregados o sodio. Mantenga un peso saludable El ndice de masa muscular [facility]) es una medida que puede utilizarse para identificar posibles problemas de [place]. Proporciona una estimacin de la grasa corporal basndose en el peso y la altura. Su mdico puede ayudarle a Radiation protection practitioner [place] y a Scientist, forensic o Theatre manager un peso saludable. Haga ejercicio con regularidad Haga ejercicio con regularidad. Esta es una de las prcticas ms importantes que puede hacer por su salud. La [employer] de los adultos deben seguir estas pautas: Optometrist, al menos, 150 minutos de actividad fsica por semana. El ejercicio debe aumentar la frecuencia cardaca y Nature conservation officer transpirar (ejercicio de intensidad moderada). Hacer ejercicios de fortalecimiento por lo [employer] por semana. Agregue esto a su plan de ejercicio de intensidad moderada. Pase menos tiempo sentado. Incluso la actividad fsica ligera puede ser beneficiosa. Controle sus niveles de colesterol y lpidos en la sangre Comience a realizarse anlisis de lpidos y Research officer, trade union en la sangre a los 3 aos y luego reptalos cada 5 aos. Es posible que Automotive engineer los niveles de colesterol con mayor frecuencia si: Sus niveles de lpidos y colesterol son altos. Es mayor de 31 aos. Presenta un alto riesgo de padecer enfermedades cardacas. Qu debo saber sobre las pruebas de deteccin del cncer? Muchos tipos de cncer pueden detectarse de manera temprana y, a menudo, pueden prevenirse. Segn su historia clnica  y sus antecedentes familiares, es posible que deba realizarse pruebas de deteccin del cncer en diferentes edades. Esto puede incluir pruebas de deteccin de lo siguiente: Surveyor, minerals. Cncer de prstata. Cncer de piel. Cncer de pulmn. Qu debo saber sobre la enfermedad cardaca, la diabetes y la hipertensin arterial? Presin arterial y enfermedad cardaca La hipertensin arterial causa enfermedades cardacas y [place] el riesgo de accidente cerebrovascular. Es ms probable que esto se manifieste en las personas que tienen lecturas de presin arterial alta o tienen sobrepeso. Hable con el mdico sobre sus valores de presin arterial deseados. Hgase controlar la presin arterial: Cada 3 a 5 aos si tiene entre 18 y 66 aos. Todos los aos si es mayor de 40 aos. Si tiene entre 44 y 62 aos y es fumador o Insurance account manager, pregntele al mdico si debe realizarse una prueba de deteccin de aneurisma artico abdominal (AAA) por nica vez. Diabetes Realcese exmenes de deteccin de la diabetes con regularidad. Este anlisis revisa el nivel de azcar en la sangre en [place]. Hgase las pruebas de deteccin: Cada tres aos despus de los 53 aos de edad si tiene un peso normal y un bajo riesgo de padecer diabetes. Con ms frecuencia y a partir de [place] edad inferior si tiene sobrepeso o un alto riesgo de padecer diabetes. Qu debo saber sobre la prevencin de infecciones? Hepatitis B Si tiene un riesgo ms alto de contraer hepatitis B, debe someterse a un examen de deteccin de este virus. Hable con el mdico para averiguar si tiene riesgo de contraer la infeccin por hepatitis B. Hepatitis C Se recomienda un anlisis de [place] para: Todos los que nacieron entre 1945 y [phone]. Todas las personas que tengan un riesgo de haber contrado hepatitis C. Enfermedades de transmisin sexual (ETS) Debe realizarse pruebas de deteccin de ITS todos los aos, incluidas la gonorrea y la clamidia, si: Es  sexualmente activo y es menor de 72 aos. Es mayor de 90 aos, y Investment banker, operational informa que corre riesgo de tener este tipo de infecciones. La actividad sexual ha [place] desde que le hicieron la ltima prueba  de deteccin y tiene un riesgo mayor de tener clamidia o Radio broadcast assistant. Pregntele al mdico si usted tiene riesgo. Pregntele al mdico si usted tiene un alto riesgo de Museum/gallery curator VIH. El mdico tambin puede recomendarle un medicamento recetado para ayudar a evitar la infeccin por el VIH. Si elige tomar medicamentos para prevenir el VIH, primero debe [employer] de deteccin del VIH. Luego debe hacerse anlisis cada 3 meses mientras est tomando los medicamentos. Siga estas indicaciones en su casa: Consumo de alcohol No beba alcohol si el mdico se lo prohbe. Si bebe alcohol: Limite la cantidad que consume de 0 a 2 bebidas por da. Sepa cunta cantidad de alcohol hay en las bebidas que toma. En los [place], una medida equivale a una botella de cerveza de 12 oz (355 ml), un vaso de vino de 5 oz (148 ml) o un vaso de una bebida alcohlica de alta graduacin de 1 oz (44 ml). Estilo de vida No consuma ningn producto que contenga nicotina o tabaco. Estos productos incluyen cigarrillos, tabaco para Higher education careers adviser y aparatos de vapeo, como los Psychologist, sport and exercise. Si necesita ayuda para dejar de consumir estos productos, consulte al mdico. No consuma drogas. No comparta agujas. Solicite ayuda a su mdico si necesita apoyo o informacin para abandonar las drogas. Indicaciones generales Realcese los estudios de rutina de la salud, dentales y de Public librarian. [place]. Infrmele a su mdico si: Se siente deprimido con frecuencia. Alguna vez ha sido vctima de [place] o no se siente seguro en su casa. Resumen Adoptar un estilo de vida saludable y recibir atencin preventiva son importantes para promover la salud y Musician. Siga las instrucciones del mdico  acerca de una dieta saludable, el ejercicio y la realizacin de pruebas o exmenes para Engineer, building services. Siga las instrucciones del mdico con respecto al control del colesterol y la presin arterial. Esta informacin no tiene Marine scientist el consejo del mdico. Asegrese de hacerle al mdico cualquier pregunta que tenga. Document Revised: 07/21/2020 Document Reviewed: 07/21/2020 Elsevier Patient Education  2022 Maceo, MD [facility]

## 2021-04-04 NOTE — Assessment & Plan Note (Signed)
Diet and nutrition discussed.  Advised to decrease amount of daily carbohydrate intake. 

## 2021-04-13 ENCOUNTER — Ambulatory Visit
Admission: RE | Admit: 2021-04-13 | Discharge: 2021-04-13 | Disposition: A | Discharge status: Home / Self Care | Payer: Self-pay | Source: Ambulatory Visit | Attending: Emergency Medicine | Admitting: Emergency Medicine

## 2021-04-13 DIAGNOSIS — N5089 Other specified disorders of the male genital organs: Secondary | ICD-10-CM

## 2021-04-14 ENCOUNTER — Other Ambulatory Visit: Payer: Self-pay | Admitting: Emergency Medicine

## 2021-04-14 DIAGNOSIS — N433 Hydrocele, unspecified: Secondary | ICD-10-CM

## 2021-04-14 NOTE — Progress Notes (Signed)
Testicular ultrasound shows left hydrocele.  Recommend urology evaluation.

## 2021-05-06 ENCOUNTER — Other Ambulatory Visit: Payer: Self-pay | Admitting: Urology

## 2021-05-12 NOTE — H&P (Signed)
CC/HPI: cc: hydrocele  ? ?04/29/21: 56 year old man comes in today for evaluation of hydrocele. He had a scrotal ultrasound done previously that confirms a moderate size scrotal ultrasound with no other concerning findings. He has noted the swelling for approximately 3 months. It is becoming bothersome to him. He works in [employer]. Today's encounter took place with interpreting services present. He denies any blood thinners or surgery on his scrotum/penis.  ? ?  ?ALLERGIES: None  ? ?MEDICATIONS: Lisinopril 10 mg tablet  ?  ? ?GU PSH: Vasectomy, 1996 ? ?  ?   ?PSH Notes: Hand fracture (2005)  ? ?NON-GU PSH: None  ? ?GU PMH: None  ? ?NON-GU PMH: Arthritis ?Hypertension ?  ? ?FAMILY HISTORY: 3 daughters - Daughter ?Breast Cancer - Mother  ?  Notes: Mother deceased at age 68; natural causes  ?Father deceased at age 62; head injury  ? ?SOCIAL HISTORY: Marital Status: Married ?Preferred Language: Spanish; Castilian; Ethnicity:  ?Current Smoking Status: Patient has never smoked.  ? ?Tobacco Use Assessment Completed: Used Tobacco in last 30 days? ?Has never drank.  ?Does not drink caffeine. ?Patient's occupation is/was [employer]. ?  ? ?REVIEW OF SYSTEMS:    ?GU Review Male:   Patient reports leakage of urine and stream starts and stops. Patient denies frequent urination, hard to postpone urination, burning/ pain with urination, get up at night to urinate, trouble starting your stream, have to strain to urinate , erection problems, and penile pain.  ?Gastrointestinal (Upper):   Patient denies nausea, vomiting, and indigestion/ heartburn.  ?Gastrointestinal (Lower):   Patient denies diarrhea and constipation.  ?Constitutional:   Patient reports weight loss and fatigue. Patient denies fever and night sweats.  ?Skin:   Patient denies skin rash/ lesion and itching.  ?Eyes:   Patient denies blurred vision and double vision.  ?Ears/ Nose/ Throat:   Patient denies sore throat and sinus problems.  ?Hematologic/Lymphatic:    Patient denies swollen glands and easy bruising.  ?Cardiovascular:   Patient denies leg swelling and chest pains.  ?Respiratory:   Patient denies cough and shortness of breath.  ?Endocrine:   Patient denies excessive thirst.  ?Musculoskeletal:   Patient reports back pain and joint pain.   ?Neurological:   Patient reports headaches and dizziness.   ?Psychologic:   Patient denies anxiety and depression.  ? ?Notes: Painful intercourse  ?Weight loss: recent diet change ?  ? ?VITAL SIGNS:    ?  04/29/2021 08:48 AM  ?Weight 166 lb / 75.3 kg  ?Height 65 in / 165.1 cm  ?BP 151/82 mmHg  ?Pulse 62 /min  ?BMI 27.6 kg/m?  ? ?GU PHYSICAL EXAMINATION:    ?Scrotum: No lesions. No edema. No cysts. No warts.  ?Testes: Moderate hydrocele left testis. Unable to palpate left testicle. Right testicle normal.  ?Urethral Meatus: Normal size. No lesion, no wart, no discharge, no polyp. Normal location.  ?Penis: Circumcised, no warts, no cracks. No dorsal Peyronie's plaques, no left corporal Peyronie's plaques, no right corporal Peyronie's plaques, no scarring, no warts. No balanitis, no meatal stenosis.  ? ?MULTI-SYSTEM PHYSICAL EXAMINATION:    ?Constitutional: Well-nourished. No physical deformities. Normally developed. Good grooming.  ?Neck: Neck symmetrical, not swollen. Normal tracheal position.  ?Respiratory: No labored breathing, no use of accessory muscles.   ?Skin: No paleness, no jaundice, no cyanosis. No lesion, no ulcer, no rash.  ?Neurologic / Psychiatric: Oriented to time, oriented to place, oriented to person. No depression, no anxiety, no agitation.  ?Eyes: Normal conjunctivae. Normal  eyelids.  ?Ears, Nose, Mouth, and Throat: Left ear no scars, no lesions, no masses. Right ear no scars, no lesions, no masses. Nose no scars, no lesions, no masses. Normal hearing. Normal lips.  ?Musculoskeletal: Normal gait and station of head and neck.  ? ?  ?Complexity of Data:  ?Records Review:   Previous Patient Records, POC Tool  ?Urine  Test Review:   Urinalysis  ? ?PROCEDURES:    ? ?     Urinalysis ?Dipstick Dipstick Cont'd  ?Color: Yellow Bilirubin: Neg mg/dL  ?Appearance: Clear Ketones: Neg mg/dL  ?Specific Gravity: 1.025 Blood: Neg ery/uL  ?pH: 6.0 Protein: Neg mg/dL  ?Glucose: Neg mg/dL Urobilinogen: 0.2 mg/dL  ?  Nitrites: Neg  ?  Leukocyte Esterase: Neg leu/uL  ? ? ?ASSESSMENT:  ?    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 ?PLAN:    ? ?      Document ?Letter(s):  Created for Patient: Clinical Summary  ? ? ?     Notes:   Left hydrocele: I discussed with patient findings of ultrasound which are consistent with physical exam showing a left hydrocele. Options for management include observation or procedural intervention. Most definitive procedural invention would be a left hydrocelectomy. The risks and benefits of the procedure discussed with the patient in detail including but not limited to bleeding, infection, hematoma, recurrence, pain, damage surrounding structures, need for future intervention. He understands postoperative limitations of no heavy lifting for 4 to 6 weeks following the procedure as well as postoperative drain. He would like to go ahead and schedule.  ? ?His son's contact information is [phone] Bernette Redbird  ?  ? ?

## 2021-05-13 ENCOUNTER — Encounter (HOSPITAL_BASED_OUTPATIENT_CLINIC_OR_DEPARTMENT_OTHER): Payer: Self-pay | Admitting: Urology

## 2021-05-13 ENCOUNTER — Other Ambulatory Visit: Payer: Self-pay

## 2021-05-13 NOTE — Progress Notes (Signed)
Spoke w/ via phone for pre-op interview: patient ?Lab needs dos: EKG, I-Stat ?Lab results: NA ?COVID test: patient states asymptomatic no test needed. ?Arrive at 11:15 AM ?NPO after MN except clear liquids.Clear liquids from MN until 10:15 AM ?Med rec completed. ?Medications to take morning of surgery: none ?Diabetic medication: NA ?Patient instructed to bring photo id and insurance card if applicable day of surgery. ?Patient aware to have Driver (ride ) / caregiver for 24 hours after surgery. ?Patient Special Instructions: NA ?Pre-Op special Istructions: Spanish Speaking ?Patient verbalized understanding of instructions that were given at this phone interview. ?Patient denies shortness of breath, chest pain, fever, cough at this phone interview.  ?

## 2021-05-16 ENCOUNTER — Encounter (HOSPITAL_BASED_OUTPATIENT_CLINIC_OR_DEPARTMENT_OTHER): Payer: Self-pay | Admitting: Urology

## 2021-05-17 ENCOUNTER — Other Ambulatory Visit: Payer: Self-pay

## 2021-05-17 ENCOUNTER — Encounter (HOSPITAL_BASED_OUTPATIENT_CLINIC_OR_DEPARTMENT_OTHER): Payer: Self-pay | Admitting: Urology

## 2021-05-17 ENCOUNTER — Encounter (HOSPITAL_BASED_OUTPATIENT_CLINIC_OR_DEPARTMENT_OTHER)
Admission: RE | Disposition: A | Discharge status: Home / Self Care | Payer: Self-pay | Source: Home / Self Care | Attending: Urology

## 2021-05-17 ENCOUNTER — Ambulatory Visit (HOSPITAL_BASED_OUTPATIENT_CLINIC_OR_DEPARTMENT_OTHER)
Admission: RE | Admit: 2021-05-17 | Discharge: 2021-05-17 | Disposition: A | Discharge status: Home / Self Care | Payer: Self-pay | Attending: Urology | Admitting: Urology

## 2021-05-17 ENCOUNTER — Ambulatory Visit (HOSPITAL_BASED_OUTPATIENT_CLINIC_OR_DEPARTMENT_OTHER): Payer: Self-pay | Admitting: Anesthesiology

## 2021-05-17 DIAGNOSIS — N433 Hydrocele, unspecified: Secondary | ICD-10-CM

## 2021-05-17 DIAGNOSIS — I10 Essential (primary) hypertension: Secondary | ICD-10-CM | POA: Insufficient documentation

## 2021-05-17 HISTORY — DX: Essential (primary) hypertension: I10

## 2021-05-17 HISTORY — PX: HYDROCELE EXCISION: SHX482

## 2021-05-17 LAB — POCT I-STAT, CHEM 8
BUN: 11 mg/dL (ref 6–20)
Calcium, Ion: 1.22 mmol/L (ref 1.15–1.40)
Chloride: 107 mmol/L (ref 98–111)
Creatinine, Ser: 0.7 mg/dL (ref 0.61–1.24)
Glucose, Bld: 97 mg/dL (ref 70–99)
HCT: 46 % (ref 39.0–52.0)
Hemoglobin: 15.6 g/dL (ref 13.0–17.0)
Potassium: 4 mmol/L (ref 3.5–5.1)
Sodium: 145 mmol/L (ref 135–145)
TCO2: 25 mmol/L (ref 22–32)

## 2021-05-17 SURGERY — HYDROCELECTOMY
Anesthesia: General | Site: Scrotum | Laterality: Left

## 2021-05-17 MED ORDER — LACTATED RINGERS IV SOLN
INTRAVENOUS | Status: DC | PRN
Start: 2021-05-17 — End: 2021-05-17

## 2021-05-17 MED ORDER — AMISULPRIDE (ANTIEMETIC) 5 MG/2ML IV SOLN: 10.0000 mg | Freq: Once | INTRAVENOUS | Status: DC | PRN

## 2021-05-17 MED ORDER — HYDROMORPHONE HCL 1 MG/ML IJ SOLN
0.2500 mg | INTRAMUSCULAR | Status: DC | PRN
  Administered 2021-05-17: 0.25 mg via INTRAVENOUS

## 2021-05-17 MED ORDER — LACTATED RINGERS IV SOLN: INTRAVENOUS | Status: DC

## 2021-05-17 MED ORDER — PHENYLEPHRINE HCL (PRESSORS) 10 MG/ML IV SOLN
INTRAVENOUS | Status: DC | PRN
  Administered 2021-05-17: 40 ug via INTRAVENOUS

## 2021-05-17 MED ORDER — ONDANSETRON HCL 4 MG/2ML IJ SOLN
INTRAMUSCULAR | Status: DC | PRN
  Administered 2021-05-17: 4 mg via INTRAVENOUS

## 2021-05-17 MED ORDER — FENTANYL CITRATE (PF) 100 MCG/2ML IJ SOLN
INTRAMUSCULAR | Status: DC | PRN
Start: 2021-05-17 — End: 2021-05-17
  Administered 2021-05-17: 100 ug via INTRAVENOUS
  Administered 2021-05-17: 50 ug via INTRAVENOUS

## 2021-05-17 MED ORDER — 0.9 % SODIUM CHLORIDE (POUR BTL) OPTIME
TOPICAL | Status: DC | PRN
  Administered 2021-05-17: 500 mL

## 2021-05-17 MED ORDER — ACETAMINOPHEN 500 MG PO TABS
1000.0000 mg | ORAL_TABLET | Freq: Once | ORAL | Status: AC
  Administered 2021-05-17: 1000 mg via ORAL

## 2021-05-17 MED ORDER — MIDAZOLAM HCL 2 MG/2ML IJ SOLN
INTRAMUSCULAR | Status: AC
  Filled 2021-05-17: qty 2

## 2021-05-17 MED ORDER — DEXAMETHASONE SODIUM PHOSPHATE 10 MG/ML IJ SOLN
INTRAMUSCULAR | Status: AC
  Filled 2021-05-17: qty 1

## 2021-05-17 MED ORDER — LIDOCAINE HCL (PF) 2 % IJ SOLN
INTRAMUSCULAR | Status: AC
Start: 2021-05-17 — End: ?
  Filled 2021-05-17: qty 5

## 2021-05-17 MED ORDER — OXYCODONE HCL 5 MG PO TABS: 5.0000 mg | ORAL_TABLET | Freq: Once | ORAL | Status: DC | PRN

## 2021-05-17 MED ORDER — LIDOCAINE HCL (CARDIAC) PF 100 MG/5ML IV SOSY
PREFILLED_SYRINGE | INTRAVENOUS | Status: DC | PRN
  Administered 2021-05-17: 60 mg via INTRAVENOUS

## 2021-05-17 MED ORDER — EPHEDRINE SULFATE (PRESSORS) 50 MG/ML IJ SOLN
INTRAMUSCULAR | Status: DC | PRN
  Administered 2021-05-17: 10 mg via INTRAVENOUS

## 2021-05-17 MED ORDER — CEFAZOLIN SODIUM-DEXTROSE 2-4 GM/100ML-% IV SOLN: 2.0000 g | INTRAVENOUS | Status: DC

## 2021-05-17 MED ORDER — FENTANYL CITRATE (PF) 100 MCG/2ML IJ SOLN
INTRAMUSCULAR | Status: AC
  Filled 2021-05-17: qty 2

## 2021-05-17 MED ORDER — ONDANSETRON HCL 4 MG/2ML IJ SOLN
INTRAMUSCULAR | Status: AC
  Filled 2021-05-17: qty 2

## 2021-05-17 MED ORDER — CEFAZOLIN SODIUM-DEXTROSE 2-3 GM-%(50ML) IV SOLR
INTRAVENOUS | Status: DC | PRN
  Administered 2021-05-17: 2 g via INTRAVENOUS

## 2021-05-17 MED ORDER — OXYCODONE HCL 5 MG/5ML PO SOLN: 5.0000 mg | Freq: Once | ORAL | Status: DC | PRN

## 2021-05-17 MED ORDER — MIDAZOLAM HCL 2 MG/2ML IJ SOLN
INTRAMUSCULAR | Status: DC | PRN
Start: 2021-05-17 — End: 2021-05-17
  Administered 2021-05-17: 2 mg via INTRAVENOUS

## 2021-05-17 MED ORDER — OXYCODONE HCL 5 MG PO TABS: 5.0000 mg | ORAL_TABLET | Freq: Three times a day (TID) | ORAL | 0 refills | Status: DC | PRN

## 2021-05-17 MED ORDER — HEMOSTATIC AGENTS (NO CHARGE) OPTIME
TOPICAL | Status: DC | PRN
  Administered 2021-05-17: 1 via TOPICAL

## 2021-05-17 MED ORDER — HYDROMORPHONE HCL 1 MG/ML IJ SOLN
INTRAMUSCULAR | Status: AC
  Filled 2021-05-17: qty 1

## 2021-05-17 MED ORDER — ACETAMINOPHEN 500 MG PO TABS
ORAL_TABLET | ORAL | Status: AC
  Filled 2021-05-17: qty 2

## 2021-05-17 MED ORDER — DEXAMETHASONE SODIUM PHOSPHATE 10 MG/ML IJ SOLN
INTRAMUSCULAR | Status: DC | PRN
  Administered 2021-05-17: 5 mg via INTRAVENOUS

## 2021-05-17 MED ORDER — ONDANSETRON HCL 4 MG/2ML IJ SOLN: 4.0000 mg | Freq: Once | INTRAMUSCULAR | Status: DC | PRN

## 2021-05-17 MED ORDER — BUPIVACAINE HCL (PF) 0.25 % IJ SOLN
INTRAMUSCULAR | Status: DC | PRN
Start: 2021-05-17 — End: 2021-05-17
  Administered 2021-05-17: 20 mL

## 2021-05-17 MED ORDER — CEFAZOLIN SODIUM-DEXTROSE 2-4 GM/100ML-% IV SOLN
INTRAVENOUS | Status: AC
  Filled 2021-05-17: qty 100

## 2021-05-17 MED ORDER — PROPOFOL 10 MG/ML IV BOLUS
INTRAVENOUS | Status: DC | PRN
  Administered 2021-05-17: 150 mg via INTRAVENOUS

## 2021-05-17 SURGICAL SUPPLY — 43 items
ADH SKN CLS APL DERMABOND .7 (GAUZE/BANDAGES/DRESSINGS) ×1
BLADE CLIPPER SENSICLIP SURGIC (BLADE) ×3 IMPLANT
BLADE SURG 15 STRL LF DISP TIS (BLADE) ×2 IMPLANT
BLADE SURG 15 STRL SS (BLADE) ×2
BNDG GAUZE ELAST 4 BULKY (GAUZE/BANDAGES/DRESSINGS) ×3 IMPLANT
COVER BACK TABLE 60X90IN (DRAPES) ×3 IMPLANT
COVER MAYO STAND STRL (DRAPES) ×3 IMPLANT
DERMABOND ADVANCED (GAUZE/BANDAGES/DRESSINGS) ×1
DERMABOND ADVANCED .7 DNX12 (GAUZE/BANDAGES/DRESSINGS) ×2 IMPLANT
DISSECTOR ROUND CHERRY 3/8 STR (MISCELLANEOUS) IMPLANT
DRAIN PENROSE 0.25X18 (DRAIN) ×3 IMPLANT
DRAPE LAPAROTOMY 100X72 PEDS (DRAPES) ×3 IMPLANT
DRSG TEGADERM 4X4.75 (GAUZE/BANDAGES/DRESSINGS) IMPLANT
ELECT REM PT RETURN 9FT ADLT (ELECTROSURGICAL) ×2
ELECTRODE REM PT RTRN 9FT ADLT (ELECTROSURGICAL) ×2 IMPLANT
GAUZE 4X4 16PLY ~~LOC~~+RFID DBL (SPONGE) ×3 IMPLANT
GAUZE SPONGE 4X4 12PLY STRL LF (GAUZE/BANDAGES/DRESSINGS) ×1 IMPLANT
GLOVE SURG ENC MOIS LTX SZ6.5 (GLOVE) ×3 IMPLANT
GOWN STRL REUS W/TWL LRG LVL3 (GOWN DISPOSABLE) ×3 IMPLANT
HEMOSTAT ARISTA ABSORB 3G PWDR (HEMOSTASIS) ×1 IMPLANT
KIT TURNOVER CYSTO (KITS) ×3 IMPLANT
NDL HYPO 25X1 1.5 SAFETY (NEEDLE) ×2 IMPLANT
NEEDLE HYPO 22GX1.5 SAFETY (NEEDLE) ×1 IMPLANT
NEEDLE HYPO 25X1 1.5 SAFETY (NEEDLE) ×2 IMPLANT
NS IRRIG 500ML POUR BTL (IV SOLUTION) ×1 IMPLANT
PACK BASIN DAY SURGERY FS (CUSTOM PROCEDURE TRAY) ×3 IMPLANT
PANTS MESH DISP LRG (UNDERPADS AND DIAPERS) ×2 IMPLANT
PANTS MESH DISPOSABLE L (UNDERPADS AND DIAPERS) ×1
PENCIL SMOKE EVACUATOR (MISCELLANEOUS) ×3 IMPLANT
SUT CHROMIC 3 0 PS 2 (SUTURE) ×3 IMPLANT
SUT ETHILON 4 0 PS 2 18 (SUTURE) ×3 IMPLANT
SUT MNCRL AB 4-0 PS2 18 (SUTURE) ×3 IMPLANT
SUT VIC AB 2-0 SH 27 (SUTURE)
SUT VIC AB 2-0 SH 27XBRD (SUTURE) IMPLANT
SUT VIC AB 3-0 SH 27 (SUTURE) ×4
SUT VIC AB 3-0 SH 27X BRD (SUTURE) ×4 IMPLANT
SYR BULB IRRIG 60ML STRL (SYRINGE) ×1 IMPLANT
SYR CONTROL 10ML LL (SYRINGE) ×3 IMPLANT
TOWEL OR 17X26 10 PK STRL BLUE (TOWEL DISPOSABLE) ×5 IMPLANT
TRAY DSU PREP LF (CUSTOM PROCEDURE TRAY) ×3 IMPLANT
TUBE CONNECTING 12X1/4 (SUCTIONS) ×3 IMPLANT
WATER STERILE IRR 500ML POUR (IV SOLUTION) IMPLANT
YANKAUER SUCT BULB TIP NO VENT (SUCTIONS) ×3 IMPLANT

## 2021-05-17 NOTE — Discharge Instructions (Addendum)
Scrotal surgery postoperative instructions ? ?Wound: ? ?In most cases your incision will have absorbable sutures that will dissolve within the first 10-20 days. Some will fall out even earlier. Expect some redness as the sutures dissolved but this should occur only around the sutures. If there is generalized redness, especially with increasing pain or swelling, let us know. The scrotum will very likely get "black and blue" as the blood in the tissues spread. Sometimes the whole scrotum will turn colors. The black and blue is followed by a yellow and brown color. In time, all the discoloration will go away. In some cases some firm swelling in the area of the testicle may persist for up to 4-6 weeks after the surgery and is considered normal in most cases. ? ?Diet: ? ?You may return to your normal diet within 24 hours following your surgery. You may note some mild nausea and possibly vomiting the first 6-8 hours following surgery. This is usually due to the side effects of anesthesia, and will disappear quite soon. I would suggest clear liquids and a very light meal the first evening following your surgery. ? ?Activity: ? ?Your physical activity should be restricted the first 48 hours. During that time you should remain relatively inactive, moving about only when necessary. During the first 7-10 days following surgery he should avoid lifting any heavy objects (anything greater than 15 pounds), and avoid strenuous exercise. If you work, ask [place] specifically about your restrictions, both for work and home. We will write a note to your employer if needed. ? ?You should plan to wear a tight pair of jockey shorts or an athletic supporter for the first 4-5 days, even to sleep. This will keep the scrotum immobilized to some degree and keep the swelling down. ? ?Ice packs should be placed on and off over the scrotum for the first 48 hours. Frozen peas or corn in a ZipLock bag can be frozen, used and re-frozen. Fifteen minutes  on and 15 minutes off is a reasonable schedule. The ice is a good pain reliever and keeps the swelling down. ? ?Hygiene: ? ?You may shower 48 hours after your surgery. Tub bathing should be restricted until the seventh day. ? ? ? ? ? ? ? ? ? ?Medication: ? ?You will be sent home with some type of pain medication. In many cases you will be sent home with a narcotic pain pill (hydrococone or oxycodone). If the pain is not too bad, you may take either Tylenol (acetaminophen) or Advil (ibuprofen) which contain no narcotic agents, and might be tolerated a little better, with fewer side effects. If the pain medication you are sent home with does not control the pain, you will have to let us know. Some narcotic pain medications cannot be given or refilled by a phone call to a pharmacy. ? ?Problems you should report to [place]: ? ?Fever of 101.0 degrees Fahrenheit or greater. ?Moderate or severe swelling under the skin incision or involving the scrotum. ?Drug reaction such as hives, a rash, nausea or vomiting. ? ? ? ?Post Anesthesia Home Care Instructions ? ?Activity: ?Get plenty of rest for the remainder of the day. A responsible individual must stay with you for 24 hours following the procedure.  ?For the next 24 hours, DO NOT: ?-Drive a car ?-Paediatric nurse ?-Drink alcoholic beverages ?-Take any medication unless instructed by your physician ?-Make any legal decisions or sign important papers. ? ?Meals: ?Start with liquid foods such as gelatin or soup. Progress  to regular foods as tolerated. Avoid greasy, spicy, heavy foods. If nausea and/or vomiting occur, drink only clear liquids until the nausea and/or vomiting subsides. Call your physician if vomiting continues. ? ?Special Instructions/Symptoms: ?Your throat may feel dry or sore from the anesthesia or the breathing tube placed in your throat during surgery. If this causes discomfort, gargle with warm salt water. The discomfort should disappear within 24 hours. ? ?Do  not take any Tylenol until after 5:00 pm today. ?    ? ?

## 2021-05-17 NOTE — Anesthesia Preprocedure Evaluation (Addendum)
Anesthesia Evaluation  ?Patient identified by MRN, date of birth, ID band ?Patient awake ? ? ? ?Reviewed: ?Allergy & Precautions, NPO status , Patient's Chart, lab work & pertinent test results ? ?Airway ?Mallampati: III ? ?TM Distance: >3 FB ?Neck ROM: Full ? ? ? Dental ? ?(+) Teeth Intact, Dental Advisory Given ?  ?Pulmonary ?neg pulmonary ROS,  ?  ?Pulmonary exam normal ?breath sounds clear to auscultation ? ? ? ? ? ? Cardiovascular ?hypertension (140/89 in preop), Pt. on medications ?Normal cardiovascular exam ?Rhythm:Regular Rate:Normal ? ? ?  ?Neuro/Psych ?negative neurological ROS ? negative psych ROS  ? GI/Hepatic ?negative GI ROS, Neg liver ROS,   ?Endo/Other  ?negative endocrine ROS ? Renal/GU ?negative Renal ROS  ?negative genitourinary ?  ?Musculoskeletal ?negative musculoskeletal ROS ?(+)  ? Abdominal ?  ?Peds ? Hematology ?negative hematology ROS ?(+)   ?Anesthesia Other Findings ? ? Reproductive/Obstetrics ?L hydrocele  ? ?  ? ? ? ? ? ? ? ? ? ? ? ? ? ?  ?  ? ? ? ? ? ? ? ?Anesthesia Physical ?Anesthesia Plan ? ?ASA: 2 ? ?Anesthesia Plan: General  ? ?Post-op Pain Management: Tylenol PO (pre-op)* and Toradol IV (intra-op)*  ? ?Induction: Intravenous ? ?PONV Risk Score and Plan: 2 and Ondansetron, Dexamethasone, Midazolam and Treatment may vary due to age or medical condition ? ?Airway Management Planned: LMA ? ?Additional Equipment: None ? ?Intra-op Plan:  ? ?Post-operative Plan: Extubation in OR ? ?Informed Consent: I have reviewed the patients History and Physical, chart, labs and discussed the procedure including the risks, benefits and alternatives for the proposed anesthesia with the patient or authorized representative who has indicated his/her understanding and acceptance.  ? ? ? ?Dental advisory given and Interpreter used for interveiw ? ?Plan Discussed with: CRNA ? ?Anesthesia Plan Comments:   ? ? ? ? ? ?Anesthesia Quick Evaluation ? ?

## 2021-05-17 NOTE — Op Note (Signed)
PATIENT:  Barry Roy ? ?PRE-OPERATIVE DIAGNOSIS: ? ?POST-OPERATIVE DIAGNOSIS:  Same ? ?PROCEDURE:  Left hydrocelectomy ? ?SURGEON:  Kasandra Knudsen, MD ? ?INDICATION:  ? ?ANESTHESIA:  General ? ?EBL:  Minimal ? ?DRAINS: None ? ?LOCAL MEDICATIONS USED:  None ? ?SPECIMEN:  None ? ?DISPOSITION OF SPECIMEN:  N/A ? ?Description of procedure: After informed consent the patient was brought to the major OR, placed on the table and administered general anesthesia. His genitalia was then sterilely prepped and draped. An official timeout was then performed. ? ?A transverse scrotal incision was made over the left hemiscrotum and carried down over the left hydrocele. The tissue over the hydrocele was cleared using a combination of sharp and blunt technique. The testicle was then delivered into the field.  The hydrocele was then opened, drained of clear amber fluid and delivered through the incision. The excess hydrocele sac tissue was excised with the Bovie electrocautery and then I cauterized the edges. I then reapproximated the edges posteriorly behind the epididymis with a running, locking 3-0 vicryl suture. The appendix testis was removed with the Bovie. ? ? ?Hemostasis was then used with bovie electrocautery on the dartos.  Arista was also used a hemostatic agent. His testicle was then replaced in the normal anatomic position and his left hemiscrotum. I then closed the deep scrotal tissue with running 3-0 vicryl suture fashion. I injected quarter percent Marcaine in the subcutaneous tissue and closed the skin with running 3-0 chromic. Dermabond, a sterile gauze dressing, fluff Kerlix and a scrotal support were applied. The patient tolerated the procedure well no intraoperative complications. Needle sponge and instrument counts were correct at the end of the operation.  ? ?PLAN OF CARE: Discharge to home after PACU ? ?PATIENT DISPOSITION:  PACU - hemodynamically stable. ? ?

## 2021-05-17 NOTE — Anesthesia Procedure Notes (Signed)
Procedure Name: LMA Insertion ?Date/Time: 05/17/2021 12:52 PM ?Performed by: Karen Kitchens, CRNA ?Pre-anesthesia Checklist: Patient identified, Emergency Drugs available, Suction available and Patient being monitored ?Patient Re-evaluated:Patient Re-evaluated prior to induction ?Oxygen Delivery Method: Circle system utilized ?Preoxygenation: Pre-oxygenation with 100% oxygen ?Induction Type: IV induction ?Ventilation: Mask ventilation without difficulty ?LMA: LMA inserted ?LMA Size: 4.0 ?Number of attempts: 1 ?Airway Equipment and Method: Bite block ?Placement Confirmation: positive ETCO2, CO2 detector and breath sounds checked- equal and bilateral ?Tube secured with: Tape ?Dental Injury: Teeth and Oropharynx as per pre-operative assessment  ? ? ? ? ?

## 2021-05-17 NOTE — Interval H&P Note (Signed)
History and Physical Interval Note: ? ?05/17/2021 ?12:22 PM ? ?Laurin Morgenstern  has presented today for surgery, with the diagnosis of LEFT HYDROCELE.  The various methods of treatment have been discussed with the patient and family. After consideration of risks, benefits and other options for treatment, the patient has consented to  Procedure(s) with comments: ?HYDROCELECTOMY ADULT (Left) - 1 HR as a surgical intervention.  The patient's history has been reviewed, patient examined, no change in status, stable for surgery.  I have reviewed the patient's chart and labs.  Questions were answered to the patient's satisfaction.   ? ? ?Yessica Putnam D Selso Mannor ? ? ?

## 2021-05-17 NOTE — Anesthesia Postprocedure Evaluation (Signed)
Anesthesia Post Note ? ?Patient: Barry Roy ? ?Procedure(s) Performed: HYDROCELECTOMY ADULT (Left: Scrotum) ? ?  ? ?Patient location during evaluation: PACU ?Anesthesia Type: General ?Level of consciousness: awake and alert, oriented and patient cooperative ?Pain management: pain level controlled ?Vital Signs Assessment: post-procedure vital signs reviewed and stable ?Respiratory status: spontaneous breathing, nonlabored ventilation and respiratory function stable ?Cardiovascular status: blood pressure returned to baseline and stable ?Postop Assessment: no apparent nausea or vomiting ?Anesthetic complications: no ? ? ?No notable events documented. ? ?Last Vitals:  ?Vitals:  ? 05/17/21 1335 05/17/21 1345  ?BP:  128/80  ?Pulse: 61 70  ?Resp: 13 13  ?Temp:    ?SpO2: 99% 100%  ?  ?Last Pain:  ?Vitals:  ? 05/17/21 1409  ?TempSrc:   ?PainSc: 5   ? ? ?  ?  ?  ?  ?  ?  ? ?Jarome Matin Khalid Lacko ? ? ? ? ?

## 2021-05-17 NOTE — Transfer of Care (Signed)
Immediate Anesthesia Transfer of Care Note  Patient: Barry Roy  Procedure(s) Performed: HYDROCELECTOMY ADULT (Left: Scrotum)  Patient Location: PACU  Anesthesia Type:General  Level of Consciousness: awake, alert  and oriented  Airway & Oxygen Therapy: Patient Spontanous Breathing and Patient connected to face mask oxygen  Post-op Assessment: Report given to RN and Post -op Vital signs reviewed and stable  Post vital signs: Reviewed and stable  Last Vitals:  Vitals Value Taken Time  BP 115/69 05/17/21 1330  Temp    Pulse 63 05/17/21 1332  Resp 10 05/17/21 1332  SpO2 100 % 05/17/21 1332  Vitals shown include unvalidated device data.  Last Pain:  Vitals:   05/17/21 1102  TempSrc: Oral  PainSc: 2       Patients Stated Pain Goal: 4 (05/17/21 1102)  Complications: No notable events documented.

## 2021-05-18 ENCOUNTER — Encounter (HOSPITAL_BASED_OUTPATIENT_CLINIC_OR_DEPARTMENT_OTHER): Payer: Self-pay | Admitting: Urology

## 2021-07-06 ENCOUNTER — Ambulatory Visit (INDEPENDENT_AMBULATORY_CARE_PROVIDER_SITE_OTHER): Payer: Self-pay | Admitting: Emergency Medicine

## 2021-07-06 ENCOUNTER — Encounter: Payer: Self-pay | Admitting: Emergency Medicine

## 2021-07-06 VITALS — BP 102/70 | HR 58 | Temp 98.0°F | Ht 65.0 in | Wt 157.0 lb

## 2021-07-06 DIAGNOSIS — I10 Essential (primary) hypertension: Secondary | ICD-10-CM

## 2021-07-06 DIAGNOSIS — N433 Hydrocele, unspecified: Secondary | ICD-10-CM | POA: Insufficient documentation

## 2021-07-06 DIAGNOSIS — R7303 Prediabetes: Secondary | ICD-10-CM

## 2021-07-06 DIAGNOSIS — Z1211 Encounter for screening for malignant neoplasm of colon: Secondary | ICD-10-CM

## 2021-07-06 NOTE — Assessment & Plan Note (Signed)
Diet and nutrition discussed.  Advised to decrease amount of daily carbohydrate intake. 

## 2021-07-06 NOTE — Patient Instructions (Signed)
Mantenimiento de la salud en los hombres Health Maintenance, Male Adoptar un estilo de vida saludable y recibir atencin preventiva son importantes para promover la salud y el bienestar. Consulte al mdico sobre: El esquema adecuado para hacerse pruebas y exmenes peridicos. Cosas que puede hacer por su cuenta para prevenir enfermedades y mantenerse sano. Qu debo saber sobre la dieta, el peso y el ejercicio? Consuma una dieta saludable  Consuma una dieta que incluya muchas verduras, frutas, productos lcteos con bajo contenido de grasa y protenas magras. No consuma muchos alimentos ricos en grasas slidas, azcares agregados o sodio. Mantenga un peso saludable El ndice de masa muscular (IMC) es una medida que puede utilizarse para identificar posibles problemas de peso. Proporciona una estimacin de la grasa corporal basndose en el peso y la altura. Su mdico puede ayudarle a determinar su IMC y a lograr o mantener un peso saludable. Haga ejercicio con regularidad Haga ejercicio con regularidad. Esta es una de las prcticas ms importantes que puede hacer por su salud. La mayora de los adultos deben seguir estas pautas: Realizar, al menos, 150 minutos de actividad fsica por semana. El ejercicio debe aumentar la frecuencia cardaca y hacerlo transpirar (ejercicio de intensidad moderada). Hacer ejercicios de fortalecimiento por lo menos dos veces por semana. Agregue esto a su plan de ejercicio de intensidad moderada. Pase menos tiempo sentado. Incluso la actividad fsica ligera puede ser beneficiosa. Controle sus niveles de colesterol y lpidos en la sangre Comience a realizarse anlisis de lpidos y colesterol en la sangre a los 20 aos y luego reptalos cada 5 aos. Es posible que necesite controlar los niveles de colesterol con mayor frecuencia si: Sus niveles de lpidos y colesterol son altos. Es mayor de 40 aos. Presenta un alto riesgo de padecer enfermedades cardacas. Qu debo  saber sobre las pruebas de deteccin del cncer? Muchos tipos de cncer pueden detectarse de manera temprana y, a menudo, pueden prevenirse. Segn su historia clnica y sus antecedentes familiares, es posible que deba realizarse pruebas de deteccin del cncer en diferentes edades. Esto puede incluir pruebas de deteccin de lo siguiente: Cncer colorrectal. Cncer de prstata. Cncer de piel. Cncer de pulmn. Qu debo saber sobre la enfermedad cardaca, la diabetes y la hipertensin arterial? Presin arterial y enfermedad cardaca La hipertensin arterial causa enfermedades cardacas y aumenta el riesgo de accidente cerebrovascular. Es ms probable que esto se manifieste en las personas que tienen lecturas de presin arterial alta o tienen sobrepeso. Hable con el mdico sobre sus valores de presin arterial deseados. Hgase controlar la presin arterial: Cada 3 a 5 aos si tiene entre 18 y 39 aos. Todos los aos si es mayor de 40 aos. Si tiene entre 65 y 75 aos y es fumador o sola fumar, pregntele al mdico si debe realizarse una prueba de deteccin de aneurisma artico abdominal (AAA) por nica vez. Diabetes Realcese exmenes de deteccin de la diabetes con regularidad. Este anlisis revisa el nivel de azcar en la sangre en ayunas. Hgase las pruebas de deteccin: Cada tres aos despus de los 45 aos de edad si tiene un peso normal y un bajo riesgo de padecer diabetes. Con ms frecuencia y a partir de una edad inferior si tiene sobrepeso o un alto riesgo de padecer diabetes. Qu debo saber sobre la prevencin de infecciones? Hepatitis B Si tiene un riesgo ms alto de contraer hepatitis B, debe someterse a un examen de deteccin de este virus. Hable con el mdico para averiguar si tiene riesgo de   contraer la infeccin por hepatitis B. Hepatitis C Se recomienda un anlisis de sangre para: Todos los que nacieron entre 1945 y 1965. Todas las personas que tengan un riesgo de haber  contrado hepatitis C. Enfermedades de transmisin sexual (ETS) Debe realizarse pruebas de deteccin de ITS todos los aos, incluidas la gonorrea y la clamidia, si: Es sexualmente activo y es menor de 24 aos. Es mayor de 24 aos, y el mdico le informa que corre riesgo de tener este tipo de infecciones. La actividad sexual ha cambiado desde que le hicieron la ltima prueba de deteccin y tiene un riesgo mayor de tener clamidia o gonorrea. Pregntele al mdico si usted tiene riesgo. Pregntele al mdico si usted tiene un alto riesgo de contraer VIH. El mdico tambin puede recomendarle un medicamento recetado para ayudar a evitar la infeccin por el VIH. Si elige tomar medicamentos para prevenir el VIH, primero debe hacerse los anlisis de deteccin del VIH. Luego debe hacerse anlisis cada 3 meses mientras est tomando los medicamentos. Siga estas indicaciones en su casa: Consumo de alcohol No beba alcohol si el mdico se lo prohbe. Si bebe alcohol: Limite la cantidad que consume de 0 a 2 bebidas por da. Sepa cunta cantidad de alcohol hay en las bebidas que toma. En [place], una medida equivale a una botella de cerveza de 12 oz (355 ml), un vaso de vino de 5 oz (148 ml) o un vaso de una bebida alcohlica de alta graduacin de 1 oz (44 ml). Estilo de vida No consuma ningn producto que contenga nicotina o tabaco. Estos productos incluyen cigarrillos, tabaco para mascar y aparatos de vapeo, como los cigarrillos electrnicos. Si necesita ayuda para dejar de consumir estos productos, consulte al mdico. No consuma drogas. No comparta agujas. Solicite ayuda a su mdico si necesita apoyo o informacin para abandonar las drogas. Indicaciones generales Realcese los estudios de rutina de la salud, dentales y de la vista. Mantngase al da con las vacunas. Infrmele a su mdico si: Se siente deprimido con frecuencia. Alguna vez ha sido vctima de maltrato o no se siente seguro en su  casa. Resumen Adoptar un estilo de vida saludable y recibir atencin preventiva son importantes para promover la salud y el bienestar. Siga las instrucciones del mdico acerca de una dieta saludable, el ejercicio y la realizacin de pruebas o exmenes para detectar enfermedades. Siga las instrucciones del mdico con respecto al control del colesterol y la presin arterial. Esta informacin no tiene como fin reemplazar el consejo del mdico. Asegrese de hacerle al mdico cualquier pregunta que tenga. Document Revised: 07/21/2020 Document Reviewed: 07/21/2020 Elsevier Patient Education  2023 [employer]  

## 2021-07-06 NOTE — Assessment & Plan Note (Signed)
Well-controlled hypertension.  Continue lisinopril 10 mg daily. ?Dietary approaches to stop hypertension discussed. ?Cardiovascular risks associated with hypertension discussed. ?Advised to continue monitoring blood pressure readings at home and keep a log. ?Follow-up in 6 months. ?

## 2021-07-06 NOTE — Progress Notes (Signed)
Barry Roy ?56 y.o. ? ? ?Chief Complaint  ?Patient presents with  ? Follow-up  ?  No concerns  ? ? ?HISTORY OF PRESENT ILLNESS: ?This is a 56 y.o. male here for follow-up of visit last February 2023 when he presented with testicular swelling and tenderness.  Ultrasound showed moderate hydrocele.  Was seen by urologist who did surgical procedure without complications and good success.  No complaints.  Urinating well. ?Also has history of hypertension on lisinopril 10 mg daily.  Normal blood pressure readings at home. ?No complaints or medical concerns today. ?BP Readings from Last 3 Encounters:  ?07/06/21 102/70  ?05/17/21 124/80  ?04/04/21 124/70  ? ? ? ?HPI ? ? ?Prior to Admission medications   ?Medication Sig Start Date End Date Taking? Authorizing Provider  ?lisinopril (ZESTRIL) 10 MG tablet Take 1 tablet (10 mg total) by mouth daily. 04/04/21  Yes Mart Colpitts, Eilleen Kempf, MD  ?oxyCODONE (ROXICODONE) 5 MG immediate release tablet Take 1 tablet (5 mg total) by mouth every 8 (eight) hours as needed. ?Patient not taking: Reported on 07/06/2021 05/17/21 05/17/22  Noel Christmas, MD  ? ? ?No Known Allergies ? ?Patient Active Problem List  ? Diagnosis Date Noted  ? Testicular swelling, left 04/04/2021  ? Prediabetes 04/04/2021  ? Essential hypertension 01/13/2021  ? Cervicalgia 04/19/2016  ? ? ?Past Medical History:  ?Diagnosis Date  ? Hypertension   ? ? ?Past Surgical History:  ?Procedure Laterality Date  ? FINGER SURGERY Left   ? fifth finger; work injury  ? HYDROCELE EXCISION Left 05/17/2021  ? Procedure: HYDROCELECTOMY ADULT;  Surgeon: Noel Christmas, MD;  Location: [facility];  Service: Urology;  Laterality: Left;  1 HR  ? VASECTOMY    ? ? ?Social History  ? ?Socioeconomic History  ? Marital status: Married  ?  Spouse name: [name]  ? Number of children: 3  ? Years of education: 5th grade  ? Highest education level: Not on file  ?Occupational History  ? Occupation: Holiday representative  ?   Comment: brick mason  ?Tobacco Use  ? Smoking status: Never  ? Smokeless tobacco: Never  ?Substance and Sexual Activity  ? Alcohol use: No  ?  Alcohol/week: 0.0 standard drinks  ? Drug use: No  ? Sexual activity: Yes  ?  Partners: Female  ?Other Topics Concern  ? Not on file  ?Social History Narrative  ? From [place], [place]. Moved to the [place] in 1985.  ? Lives with his wife, son and daughter-in-law.  ? 2 sons live on-campus at [employer] and [address].  ? ?Social Determinants of Health  ? ?Financial Resource Strain: Not on file  ?Food Insecurity: Not on file  ?Transportation Needs: Not on file  ?Physical Activity: Not on file  ?Stress: Not on file  ?Social Connections: Not on file  ?Intimate Partner Violence: Not on file  ? ? ?Family History  ?Problem Relation Age of Onset  ? Breast cancer Mother   ? Arthritis Sister   ?     knee  ? ? ? ?Review of Systems  ?Constitutional: Negative.  Negative for chills and fever.  ?HENT: Negative.  Negative for congestion and sore throat.   ?Eyes: Negative.   ?Respiratory: Negative.  Negative for cough and shortness of breath.   ?Cardiovascular: Negative.  Negative for chest pain and palpitations.  ?Genitourinary: Negative.  Negative for dysuria, frequency and urgency.  ?Skin: Negative.  Negative for rash.  ?Neurological: Negative.  Negative for dizziness and  headaches.  ?All other systems reviewed and are negative. ?Today's Vitals  ? 07/06/21 0839  ?BP: 102/70  ?Pulse: (!) 58  ?Temp: 98 ?F (36.7 ?C)  ?TempSrc: Oral  ?SpO2: 98%  ?Weight: 157 lb (71.2 kg)  ?Height: 5\' 5"  (1.651 m)  ? ?Body mass index is 26.13 kg/m?. ? ? ?Physical Exam ?Vitals reviewed.  ?Constitutional:   ?   Appearance: Normal appearance.  ?HENT:  ?   Head: Normocephalic.  ?Eyes:  ?   Extraocular Movements: Extraocular movements intact.  ?   Conjunctiva/sclera: Conjunctivae normal.  ?   Pupils: Pupils are equal, round, and reactive to light.  ?Cardiovascular:  ?   Rate and Rhythm: Normal rate and regular rhythm.  ?    Pulses: Normal pulses.  ?   Heart sounds: Normal heart sounds.  ?Pulmonary:  ?   Effort: Pulmonary effort is normal.  ?   Breath sounds: Normal breath sounds.  ?Abdominal:  ?   Palpations: Abdomen is soft.  ?   Tenderness: There is no abdominal tenderness.  ?Musculoskeletal:     ?   General: Normal range of motion.  ?   Cervical back: No tenderness.  ?Lymphadenopathy:  ?   Cervical: No cervical adenopathy.  ?Skin: ?   General: Skin is warm and dry.  ?Neurological:  ?   General: No focal deficit present.  ?   Mental Status: He is alert and oriented to person, place, and time.  ?Psychiatric:     ?   Mood and Affect: Mood normal.     ?   Behavior: Behavior normal.  ? ? ?ASSESSMENT & PLAN: ?Problem List Items Addressed This Visit   ? ?  ? Cardiovascular and Mediastinum  ? Essential hypertension - Primary  ?  Well-controlled hypertension.  Continue lisinopril 10 mg daily. ?Dietary approaches to stop hypertension discussed. ?Cardiovascular risks associated with hypertension discussed. ?Advised to continue monitoring blood pressure readings at home and keep a log. ?Follow-up in 6 months. ? ?  ?  ?  ? Genitourinary  ? Hydrocele in adult  ?  Improved and resolved.  Asymptomatic. ? ?  ?  ?  ? Other  ? Prediabetes  ?  Diet and nutrition discussed.  Advised to decrease amount of daily carbohydrate intake. ? ?  ?  ? ?Other Visit Diagnoses   ? ? Colon cancer screening      ? Relevant Orders  ? Ambulatory referral to Gastroenterology  ? ?  ? ? ? ?Patient Instructions  ?Mantenimiento de Dover Corporationla salud en los hombres ?Health Maintenance, Male ?Adoptar un estilo de vida saludable y recibir atenci?n preventiva son importantes para promover la salud y Counsellorel bienestar. Consulte al m?dico sobre: ?El esquema adecuado para Huetterhacerse pruebas y ex?menes peri?dicos. ?Cosas que puede hacer por su cuenta para prevenir enfermedades y [place] Alb[place]mantenerse sano. ??Qu? debo saber sobre la dieta, el peso y el ejercicio? ?Consuma una dieta saludable ? ?Consuma una dieta  que incluya muchas verduras, frutas, productos l?cteos con bajo contenido de grasa y prote?nas magras. ?No consuma muchos alimentos ricos en grasas s?lidas, az?cares agregados o sodio. ?Mantenga un peso saludable ?El ?ndice de masa muscular [facility](IMC) es una medida que puede utilizarse para identificar posibles problemas de [place]peso. Proporciona una estimaci?n de la grasa corporal bas?ndose en el peso y la altura. Su m?dico puede ayudarle a Engineer, sitedeterminar su IMC y a Personnel officerlograr o Pharmacologistmantener un peso saludable. ?Haga ejercicio con regularidad ?Haga ejercicio con regularidad. Esta es una de las pr?cticas m?s  importantes que puede hacer por su salud. La mayor?a de los adultos deben seguir estas pautas: ?Realizar, al menos, 150 minutos de [place] f?sica por semana. El ejercicio debe aumentar la frecuencia card?aca y Media planner transpirar (ejercicio de intensidad moderada). ?Hacer ejercicios de fortalecimiento por lo [employer] por semana. Agregue esto a su plan de ejercicio de intensidad moderada. ?Pase menos tiempo sentado. Incluso la actividad f?sica ligera puede ser beneficiosa. ?Controle sus niveles de colesterol y l?pidos en la sangre ?Comience a realizarse an?lisis de l?pidos y Oncologist en la sangre a los 20 a?os y luego rep?talos cada 5 a?os. ?Es posible que Insurance underwriter los niveles de colesterol con mayor frecuencia si: ?Sus niveles de l?pidos y colesterol son altos. ?Es mayor de 40 a?os. ?Presenta un alto riesgo de padecer enfermedades card?acas. ??Qu? debo saber sobre las pruebas de detecci?n del c?ncer? ?Muchos tipos de c?ncer pueden detectarse de manera temprana y, a menudo, pueden prevenirse. Seg?n su historia cl?nica y sus antecedentes familiares, es posible que deba realizarse pruebas de detecci?n del c?ncer en diferentes edades. Esto puede incluir pruebas de detecci?n de lo siguiente: ?C?ncer colorrectal. ?C?ncer de pr?stata. ?C?ncer de piel. ?C?ncer de pulm?n. ??Qu? debo saber sobre la enfermedad card?aca, la  diabetes y la hipertensi?n arterial? ?Presi?n arterial y enfermedad card?aca ?La hipertensi?n arterial causa enfermedades card?acas y [place] el riesgo de accidente cerebrovascular. Es m?s probable que esto se [employer]

## 2021-07-06 NOTE — Assessment & Plan Note (Signed)
Improved and resolved.  Asymptomatic. ?

## 2021-10-19 ENCOUNTER — Encounter: Payer: Self-pay | Admitting: Emergency Medicine

## 2022-01-10 ENCOUNTER — Encounter: Payer: Self-pay | Admitting: Emergency Medicine

## 2022-01-10 ENCOUNTER — Ambulatory Visit (INDEPENDENT_AMBULATORY_CARE_PROVIDER_SITE_OTHER): Payer: Self-pay | Admitting: Emergency Medicine

## 2022-01-10 VITALS — BP 130/82 | HR 55 | Temp 98.0°F | Ht 65.0 in | Wt 151.5 lb

## 2022-01-10 DIAGNOSIS — R7303 Prediabetes: Secondary | ICD-10-CM

## 2022-01-10 DIAGNOSIS — Z114 Encounter for screening for human immunodeficiency virus [HIV]: Secondary | ICD-10-CM

## 2022-01-10 DIAGNOSIS — Z1211 Encounter for screening for malignant neoplasm of colon: Secondary | ICD-10-CM

## 2022-01-10 DIAGNOSIS — Z125 Encounter for screening for malignant neoplasm of prostate: Secondary | ICD-10-CM

## 2022-01-10 DIAGNOSIS — I10 Essential (primary) hypertension: Secondary | ICD-10-CM

## 2022-01-10 LAB — COMPREHENSIVE METABOLIC PANEL
ALT: 16 U/L (ref 0–53)
AST: 19 U/L (ref 0–37)
Albumin: 4.6 g/dL (ref 3.5–5.2)
Alkaline Phosphatase: 45 U/L (ref 39–117)
BUN: 14 mg/dL (ref 6–23)
CO2: 30 mEq/L (ref 19–32)
Calcium: 9.3 mg/dL (ref 8.4–10.5)
Chloride: 105 mEq/L (ref 96–112)
Creatinine, Ser: 0.82 mg/dL (ref 0.40–1.50)
GFR: 97.1 mL/min (ref >60.00)
Glucose, Bld: 83 mg/dL (ref 70–99)
Potassium: 4.1 mEq/L (ref 3.5–5.1)
Sodium: 140 mEq/L (ref 135–145)
Total Bilirubin: 0.7 mg/dL (ref 0.2–1.2)
Total Protein: 7.3 g/dL (ref 6.0–8.3)

## 2022-01-10 LAB — LIPID PANEL
Cholesterol: 135 mg/dL (ref 0–200)
HDL: 49.5 mg/dL (ref >39.00)
LDL Cholesterol: 74 mg/dL (ref 0–99)
NonHDL: 85.08
Total CHOL/HDL Ratio: 3
Triglycerides: 57 mg/dL (ref 0.0–149.0)
VLDL: 11.4 mg/dL (ref 0.0–40.0)

## 2022-01-10 LAB — CBC WITH DIFFERENTIAL/PLATELET
Basophils Absolute: 0.1 10*3/uL (ref 0.0–0.1)
Basophils Relative: 0.8 % (ref 0.0–3.0)
Eosinophils Absolute: 0.1 10*3/uL (ref 0.0–0.7)
Eosinophils Relative: 1 % (ref 0.0–5.0)
HCT: 43.2 % (ref 39.0–52.0)
Hemoglobin: 14.8 g/dL (ref 13.0–17.0)
Lymphocytes Relative: 24.7 % (ref 12.0–46.0)
Lymphs Abs: 1.8 10*3/uL (ref 0.7–4.0)
MCHC: 34.2 g/dL (ref 30.0–36.0)
MCV: 91.9 fl (ref 78.0–100.0)
Monocytes Absolute: 0.3 10*3/uL (ref 0.1–1.0)
Monocytes Relative: 4.4 % (ref 3.0–12.0)
Neutro Abs: 5.1 10*3/uL (ref 1.4–7.7)
Neutrophils Relative %: 69.1 % (ref 43.0–77.0)
Platelets: 211 10*3/uL (ref 150.0–400.0)
RBC: 4.69 Mil/uL (ref 4.22–5.81)
RDW: 13 % (ref 11.5–15.5)
WBC: 7.3 10*3/uL (ref 4.0–10.5)

## 2022-01-10 LAB — PSA: PSA: 0.49 ng/mL (ref 0.10–4.00)

## 2022-01-10 NOTE — Progress Notes (Signed)
Barry Roy 56 y.o.   Chief Complaint  Patient presents with   Follow-up    f/u appt, patient requesting physical, no concerns     HISTORY OF PRESENT ILLNESS: This is a 56 y.o. male A1A here for hypertension follow-up and physical. Overall doing well.  Has no complaints or medical concerns today. Blood pressure readings at home normal without medication.  HPI   Prior to Admission medications   Medication Sig Start Date End Date Taking? Authorizing Provider  lisinopril (ZESTRIL) 10 MG tablet Take 1 tablet (10 mg total) by mouth daily. 04/04/21  Yes Georgina Quint, MD    No Known Allergies  Patient Active Problem List   Diagnosis Date Noted   Hydrocele in adult 07/06/2021   Testicular swelling, left 04/04/2021   Prediabetes 04/04/2021   Essential hypertension 01/13/2021   Cervicalgia 04/19/2016    Past Medical History:  Diagnosis Date   Hypertension     Past Surgical History:  Procedure Laterality Date   FINGER SURGERY Left    fifth finger; work injury   HYDROCELE EXCISION Left 05/17/2021   Procedure: HYDROCELECTOMY ADULT;  Surgeon: Noel Christmas, MD;  Location: [facility];  Service: Urology;  Laterality: Left;  1 HR   VASECTOMY      Social History   Socioeconomic History   Marital status: Married    Spouse name: [name]   Number of children: 3   Years of education: 5th grade   Highest education level: Not on file  Occupational History   Occupation: Holiday representative    Comment: Scientist, water quality  Tobacco Use   Smoking status: Never   Smokeless tobacco: Never  Substance and Sexual Activity   Alcohol use: No    Alcohol/week: 0.0 standard drinks of alcohol   Drug use: No   Sexual activity: Yes    Partners: Female  Other Topics Concern   Not on file  Social History Narrative   From [place], [place]. Moved to the [place] in 1985.   Lives with his wife, son and daughter-in-law.   2 sons live on-campus at [employer] and [address].    Social Determinants of Health   Financial Resource Strain: Not on file  Food Insecurity: Not on file  Transportation Needs: Not on file  Physical Activity: Not on file  Stress: Not on file  Social Connections: Not on file  Intimate Partner Violence: Not on file    Family History  Problem Relation Age of Onset   Breast cancer Mother    Arthritis Sister        knee     Review of Systems  Constitutional: Negative.  Negative for chills and fever.  HENT: Negative.  Negative for congestion and sore throat.   Respiratory: Negative.  Negative for cough and shortness of breath.   Cardiovascular: Negative.  Negative for chest pain and palpitations.  Gastrointestinal: Negative.  Negative for abdominal pain, diarrhea, nausea and vomiting.  Genitourinary: Negative.  Negative for dysuria and hematuria.  Skin: Negative.  Negative for rash.  Neurological: Negative.  Negative for dizziness and headaches.  All other systems reviewed and are negative.  Today's Vitals   01/10/22 1504  BP: 130/82  Pulse: (!) 55  Temp: 98 F (36.7 C)  TempSrc: Oral  SpO2: 97%  Weight: 151 lb 8 oz (68.7 kg)  Height: 5\' 5"  (1.651 m)   Body mass index is 25.21 kg/m. Wt Readings from Last 3 Encounters:  01/10/22 151 lb 8 oz (  68.7 kg)  07/06/21 157 lb (71.2 kg)  05/17/21 158 lb 9.6 oz (71.9 kg)     Physical Exam Vitals reviewed.  Constitutional:      Appearance: Normal appearance.  HENT:     Head: Normocephalic.     Mouth/Throat:     Mouth: Mucous membranes are moist.     Pharynx: Oropharynx is clear.  Eyes:     Extraocular Movements: Extraocular movements intact.     Conjunctiva/sclera: Conjunctivae normal.     Pupils: Pupils are equal, round, and reactive to light.  Cardiovascular:     Rate and Rhythm: Normal rate and regular rhythm.     Pulses: Normal pulses.     Heart sounds: Normal heart sounds.  Pulmonary:     Effort: Pulmonary effort is normal.     Breath sounds: Normal breath  sounds.  Abdominal:     Palpations: Abdomen is soft.     Tenderness: There is no abdominal tenderness.  Musculoskeletal:     Cervical back: No tenderness.     Right lower leg: No edema.     Left lower leg: No edema.  Lymphadenopathy:     Cervical: No cervical adenopathy.  Skin:    General: Skin is warm and dry.     Capillary Refill: Capillary refill takes less than 2 seconds.  Neurological:     General: No focal deficit present.     Mental Status: He is alert and oriented to person, place, and time.  Psychiatric:        Mood and Affect: Mood normal.        Behavior: Behavior normal.      ASSESSMENT & PLAN: Problem List Items Addressed This Visit       Cardiovascular and Mediastinum   Essential hypertension - Primary    Normal blood pressure readings at home without medication for the last couple weeks. At this point recommend to stop lisinopril and continue monitoring blood pressure readings at home daily.  If elevated, restart lisinopril 10 mg daily. Cardiovascular risk associated with hypertension discussed. Dietary approaches to control hypertension discussed. Follow-up in 6 months.      Relevant Orders   CBC with Differential/Platelet   Comprehensive metabolic panel   Lipid panel     Other   Prediabetes    Diet and nutrition discussed. Hemoglobin A1c done today.      Relevant Orders   Hemoglobin A1c   Other Visit Diagnoses     Screening for HIV (human immunodeficiency virus)       Relevant Orders   HIV Antibody (routine testing w rflx)   Prostate cancer screening       Relevant Orders   PSA   Colon cancer screening       Relevant Orders   Ambulatory referral to Gastroenterology      Patient Instructions  Mantenimiento de la salud en los hombres Health Maintenance, Male Adoptar un estilo de vida saludable y recibir atencin preventiva son importantes para promover la salud y Counsellor. Consulte al mdico sobre: El esquema adecuado para hacerse  pruebas y exmenes peridicos. Cosas que puede hacer por su cuenta para prevenir enfermedades y [place] sano. Qu debo saber sobre la dieta, el peso y el ejercicio? Consuma una dieta saludable  Consuma una dieta que incluya muchas verduras, frutas, productos lcteos con bajo contenido de [place] y Associate Professor. No consuma muchos alimentos ricos en grasas slidas, azcares agregados o sodio. Mantenga un peso saludable El ndice de masa muscular (  [facility]) es una medida que puede utilizarse para identificar posibles problemas de [place]. Proporciona una estimacin de la grasa corporal basndose en el peso y la altura. Su mdico puede ayudarle a Engineer, sitedeterminar su IMC y a Personnel officerlograr o Pharmacologistmantener un peso saludable. Haga ejercicio con regularidad Haga ejercicio con regularidad. Esta es una de las prcticas ms importantes que puede hacer por su salud. La Harley-Davidsonmayora de los adultos deben seguir estas pautas: Education officer, environmentalealizar, al menos, 150 minutos de actividad fsica por semana. El ejercicio debe aumentar la frecuencia cardaca y Media plannerhacerlo transpirar (ejercicio de intensidad moderada). Hacer ejercicios de fortalecimiento por lo Rite Aidmenos dos veces por semana. Agregue esto a su plan de ejercicio de intensidad moderada. Pase menos tiempo sentado. Incluso la actividad fsica ligera puede ser beneficiosa. Controle sus niveles de colesterol y lpidos en la sangre Comience a realizarse anlisis de lpidos y Oncologistcolesterol en la sangre a los 20 aos y luego reptalos cada 5 aos. Es posible que Insurance underwriternecesite controlar los niveles de colesterol con mayor frecuencia si: Sus niveles de lpidos y colesterol son altos. Es mayor de 40 aos. Presenta un alto riesgo de padecer enfermedades cardacas. Qu debo saber sobre las pruebas de deteccin del cncer? Muchos tipos de cncer pueden detectarse de manera temprana y, a menudo, pueden prevenirse. Segn su historia clnica y sus antecedentes familiares, es posible que deba realizarse pruebas de deteccin  del cncer en diferentes edades. Esto puede incluir pruebas de deteccin de lo siguiente: Building services engineerCncer colorrectal. Cncer de prstata. Cncer de piel. Cncer de pulmn. Qu debo saber sobre la enfermedad cardaca, la diabetes y la hipertensin arterial? Presin arterial y enfermedad cardaca La hipertensin arterial causa enfermedades cardacas y [place]othoaumenta el riesgo de accidente cerebrovascular. Es ms probable que esto se manifieste en las personas que tienen lecturas de presin arterial alta o tienen sobrepeso. Hable con el mdico sobre sus valores de presin arterial deseados. Hgase controlar la presin arterial: Cada 3 a 5 aos si tiene entre 18 y 7239 aos. Todos los aos si es mayor de 40 aos. Si tiene entre 65 y 3375 aos y es fumador o Insurance underwritersola fumar, pregntele al mdico si debe realizarse una prueba de deteccin de aneurisma artico abdominal (AAA) por nica vez. Diabetes Realcese exmenes de deteccin de la diabetes con regularidad. Este anlisis revisa el nivel de azcar en la sangre en [place]. Hgase las pruebas de deteccin: Cada tres aos despus de los 45 aos de edad si tiene un peso normal y un bajo riesgo de padecer diabetes. Con ms frecuencia y a partir de [place]una edad inferior si tiene sobrepeso o un alto riesgo de padecer diabetes. Qu debo saber sobre la prevencin de infecciones? Hepatitis B Si tiene un riesgo ms alto de contraer hepatitis B, debe someterse a un examen de deteccin de este virus. Hable con el mdico para averiguar si tiene riesgo de contraer la infeccin por hepatitis B. Hepatitis C Se recomienda un anlisis de [place] para: Todos los que nacieron entre 1945 y [phone]. Todas las personas que tengan un riesgo de haber contrado hepatitis C. Enfermedades de transmisin sexual (ETS) Debe realizarse pruebas de deteccin de ITS todos los aos, incluidas la gonorrea y la clamidia, si: Es sexualmente activo y es menor de [address] aos. Es mayor de [address] aos, y Public affairs consultantel mdico le  informa que corre riesgo de tener este tipo de infecciones. La actividad sexual ha cambiado desde que le hicieron la ltima prueba de deteccin y tiene un riesgo mayor de Warehouse managertener clamidia o Copygonorrea.  Pregntele al mdico si usted tiene riesgo. Pregntele al mdico si usted tiene un alto riesgo de Primary school teacher VIH. El mdico tambin puede recomendarle un medicamento recetado para ayudar a evitar la infeccin por el VIH. Si elige tomar medicamentos para prevenir el VIH, primero debe [employer] de deteccin del VIH. Luego debe hacerse anlisis cada 3 meses mientras est tomando los medicamentos. Siga estas indicaciones en su casa: Consumo de alcohol No beba alcohol si el mdico se lo prohbe. Si bebe alcohol: Limite la cantidad que consume de 0 a 2 bebidas por da. Sepa cunta cantidad de alcohol hay en las bebidas que toma. En los [address], una medida equivale a una botella de cerveza de 12 oz (355 ml), un vaso de vino de 5 oz (148 ml) o un vaso de una bebida alcohlica de alta graduacin de 1 oz (44 ml). Estilo de vida No consuma ningn producto que contenga nicotina o tabaco. Estos productos incluyen cigarrillos, tabaco para Theatre manager y aparatos de vapeo, como los Administrator, Civil Service. Si necesita ayuda para dejar de consumir estos productos, consulte al mdico. No consuma drogas. No comparta agujas. Solicite ayuda a su mdico si necesita apoyo o informacin para abandonar las drogas. Indicaciones generales Realcese los estudios de rutina de [address], dentales y de Wellsite geologist. Mantngase al da con las vacunas. Infrmele a su mdico si: Se siente deprimido con frecuencia. Alguna vez ha sido vctima de [place] o no se siente seguro en su casa. Resumen Adoptar un estilo de vida saludable y recibir atencin preventiva son importantes para promover la salud y Counsellor. Siga las instrucciones del mdico acerca de una dieta saludable, el ejercicio y la realizacin de pruebas o  exmenes para Hotel manager. Siga las instrucciones del mdico con respecto al control del colesterol y la presin arterial. Esta informacin no tiene Theme park manager el consejo del mdico. Asegrese de hacerle al mdico cualquier pregunta que tenga. Document Revised: 07/21/2020 Document Reviewed: 07/21/2020 Elsevier Patient Education  2023 Elsevier Inc.     Edwina Barth, MD [facility]

## 2022-01-10 NOTE — Assessment & Plan Note (Signed)
Diet and nutrition discussed.  Hemoglobin A1c done today. 

## 2022-01-10 NOTE — Patient Instructions (Signed)
Mantenimiento de la salud en los hombres Health Maintenance, Male Adoptar un estilo de vida saludable y recibir atencin preventiva son importantes para promover la salud y el bienestar. Consulte al mdico sobre: El esquema adecuado para hacerse pruebas y exmenes peridicos. Cosas que puede hacer por su cuenta para prevenir enfermedades y mantenerse sano. Qu debo saber sobre la dieta, el peso y el ejercicio? Consuma una dieta saludable  Consuma una dieta que incluya muchas verduras, frutas, productos lcteos con bajo contenido de grasa y protenas magras. No consuma muchos alimentos ricos en grasas slidas, azcares agregados o sodio. Mantenga un peso saludable El ndice de masa muscular (IMC) es una medida que puede utilizarse para identificar posibles problemas de peso. Proporciona una estimacin de la grasa corporal basndose en el peso y la altura. Su mdico puede ayudarle a determinar su IMC y a lograr o mantener un peso saludable. Haga ejercicio con regularidad Haga ejercicio con regularidad. Esta es una de las prcticas ms importantes que puede hacer por su salud. La mayora de los adultos deben seguir estas pautas: Realizar, al menos, 150 minutos de actividad fsica por semana. El ejercicio debe aumentar la frecuencia cardaca y hacerlo transpirar (ejercicio de intensidad moderada). Hacer ejercicios de fortalecimiento por lo menos dos veces por semana. Agregue esto a su plan de ejercicio de intensidad moderada. Pase menos tiempo sentado. Incluso la actividad fsica ligera puede ser beneficiosa. Controle sus niveles de colesterol y lpidos en la sangre Comience a realizarse anlisis de lpidos y colesterol en la sangre a los 20 aos y luego reptalos cada 5 aos. Es posible que necesite controlar los niveles de colesterol con mayor frecuencia si: Sus niveles de lpidos y colesterol son altos. Es mayor de 40 aos. Presenta un alto riesgo de padecer enfermedades cardacas. Qu debo  saber sobre las pruebas de deteccin del cncer? Muchos tipos de cncer pueden detectarse de manera temprana y, a menudo, pueden prevenirse. Segn su historia clnica y sus antecedentes familiares, es posible que deba realizarse pruebas de deteccin del cncer en diferentes edades. Esto puede incluir pruebas de deteccin de lo siguiente: Cncer colorrectal. Cncer de prstata. Cncer de piel. Cncer de pulmn. Qu debo saber sobre la enfermedad cardaca, la diabetes y la hipertensin arterial? Presin arterial y enfermedad cardaca La hipertensin arterial causa enfermedades cardacas y aumenta el riesgo de accidente cerebrovascular. Es ms probable que esto se manifieste en las personas que tienen lecturas de presin arterial alta o tienen sobrepeso. Hable con el mdico sobre sus valores de presin arterial deseados. Hgase controlar la presin arterial: Cada 3 a 5 aos si tiene entre 18 y 39 aos. Todos los aos si es mayor de 40 aos. Si tiene entre 65 y 75 aos y es fumador o sola fumar, pregntele al mdico si debe realizarse una prueba de deteccin de aneurisma artico abdominal (AAA) por nica vez. Diabetes Realcese exmenes de deteccin de la diabetes con regularidad. Este anlisis revisa el nivel de azcar en la sangre en ayunas. Hgase las pruebas de deteccin: Cada tres aos despus de los 45 aos de edad si tiene un peso normal y un bajo riesgo de padecer diabetes. Con ms frecuencia y a partir de una edad inferior si tiene sobrepeso o un alto riesgo de padecer diabetes. Qu debo saber sobre la prevencin de infecciones? Hepatitis B Si tiene un riesgo ms alto de contraer hepatitis B, debe someterse a un examen de deteccin de este virus. Hable con el mdico para averiguar si tiene riesgo de   contraer la infeccin por hepatitis B. Hepatitis C Se recomienda un anlisis de sangre para: Todos los que nacieron entre 1945 y 1965. Todas las personas que tengan un riesgo de haber  contrado hepatitis C. Enfermedades de transmisin sexual (ETS) Debe realizarse pruebas de deteccin de ITS todos los aos, incluidas la gonorrea y la clamidia, si: Es sexualmente activo y es menor de 24 aos. Es mayor de 24 aos, y el mdico le informa que corre riesgo de tener este tipo de infecciones. La actividad sexual ha cambiado desde que le hicieron la ltima prueba de deteccin y tiene un riesgo mayor de tener clamidia o gonorrea. Pregntele al mdico si usted tiene riesgo. Pregntele al mdico si usted tiene un alto riesgo de contraer VIH. El mdico tambin puede recomendarle un medicamento recetado para ayudar a evitar la infeccin por el VIH. Si elige tomar medicamentos para prevenir el VIH, primero debe hacerse los anlisis de deteccin del VIH. Luego debe hacerse anlisis cada 3 meses mientras est tomando los medicamentos. Siga estas indicaciones en su casa: Consumo de alcohol No beba alcohol si el mdico se lo prohbe. Si bebe alcohol: Limite la cantidad que consume de 0 a 2 bebidas por da. Sepa cunta cantidad de alcohol hay en las bebidas que toma. En [place], una medida equivale a una botella de cerveza de 12 oz (355 ml), un vaso de vino de 5 oz (148 ml) o un vaso de una bebida alcohlica de alta graduacin de 1 oz (44 ml). Estilo de vida No consuma ningn producto que contenga nicotina o tabaco. Estos productos incluyen cigarrillos, tabaco para mascar y aparatos de vapeo, como los cigarrillos electrnicos. Si necesita ayuda para dejar de consumir estos productos, consulte al mdico. No consuma drogas. No comparta agujas. Solicite ayuda a su mdico si necesita apoyo o informacin para abandonar las drogas. Indicaciones generales Realcese los estudios de rutina de la salud, dentales y de la vista. Mantngase al da con las vacunas. Infrmele a su mdico si: Se siente deprimido con frecuencia. Alguna vez ha sido vctima de maltrato o no se siente seguro en su  casa. Resumen Adoptar un estilo de vida saludable y recibir atencin preventiva son importantes para promover la salud y el bienestar. Siga las instrucciones del mdico acerca de una dieta saludable, el ejercicio y la realizacin de pruebas o exmenes para detectar enfermedades. Siga las instrucciones del mdico con respecto al control del colesterol y la presin arterial. Esta informacin no tiene como fin reemplazar el consejo del mdico. Asegrese de hacerle al mdico cualquier pregunta que tenga. Document Revised: 07/21/2020 Document Reviewed: 07/21/2020 Elsevier Patient Education  2023 [employer]  

## 2022-01-10 NOTE — Assessment & Plan Note (Signed)
Normal blood pressure readings at home without medication for the last couple weeks. At this point recommend to stop lisinopril and continue monitoring blood pressure readings at home daily.  If elevated, restart lisinopril 10 mg daily. Cardiovascular risk associated with hypertension discussed. Dietary approaches to control hypertension discussed. Follow-up in 6 months.

## 2022-01-11 LAB — HIV ANTIBODY (ROUTINE TESTING W REFLEX): HIV 1&2 Ab, 4th Generation: NONREACTIVE

## 2022-01-11 LAB — HEMOGLOBIN A1C: Hgb A1c MFr Bld: 5.8 % (ref 4.6–6.5)

## 2022-04-04 ENCOUNTER — Other Ambulatory Visit: Payer: Self-pay | Admitting: Emergency Medicine

## 2022-04-04 DIAGNOSIS — I10 Essential (primary) hypertension: Secondary | ICD-10-CM

## 2022-07-11 ENCOUNTER — Ambulatory Visit: Payer: Self-pay | Admitting: Emergency Medicine

## 2022-07-25 ENCOUNTER — Encounter: Payer: Self-pay | Admitting: Emergency Medicine

## 2022-07-25 ENCOUNTER — Ambulatory Visit (INDEPENDENT_AMBULATORY_CARE_PROVIDER_SITE_OTHER): Payer: Self-pay | Admitting: Emergency Medicine

## 2022-07-25 VITALS — BP 128/80 | HR 60 | Temp 98.0°F | Ht 65.0 in | Wt 158.0 lb

## 2022-07-25 DIAGNOSIS — I10 Essential (primary) hypertension: Secondary | ICD-10-CM

## 2022-07-25 DIAGNOSIS — R7303 Prediabetes: Secondary | ICD-10-CM

## 2022-07-25 DIAGNOSIS — Z1211 Encounter for screening for malignant neoplasm of colon: Secondary | ICD-10-CM

## 2022-07-25 DIAGNOSIS — Z23 Encounter for immunization: Secondary | ICD-10-CM

## 2022-07-25 NOTE — Patient Instructions (Signed)
Mantenimiento de Research officer, political party, Male Adoptar un estilo de vida saludable y recibir atencin preventiva son importantes para promover la salud y Counsellor. Consulte al mdico sobre: El esquema adecuado para hacerse pruebas y exmenes peridicos. Cosas que puede hacer por su cuenta para prevenir enfermedades y [place] sano. Qu debo saber sobre la dieta, el peso y el ejercicio? Consuma una dieta saludable  Consuma una dieta que incluya muchas verduras, frutas, productos lcteos con bajo contenido de [place] y Associate Professor. No consuma muchos alimentos ricos en grasas slidas, azcares agregados o sodio. Mantenga un peso saludable El ndice de masa muscular [facility]) es una medida que puede utilizarse para identificar posibles problemas de [place]. Proporciona una estimacin de la grasa corporal basndose en el peso y la altura. Su mdico puede ayudarle a Engineer, site IMC y a Personnel officer o Pharmacologist un peso saludable. Haga ejercicio con regularidad Haga ejercicio con regularidad. Esta es una de las prcticas ms importantes que puede hacer por su salud. La [employer] de los adultos deben seguir estas pautas: Education officer, environmental, al menos, 150 minutos de actividad fsica por semana. El ejercicio debe aumentar la frecuencia cardaca y Media planner transpirar (ejercicio de intensidad moderada). Hacer ejercicios de fortalecimiento por lo [facility] por semana. Agregue esto a su plan de ejercicio de intensidad moderada. Pase menos tiempo sentado. Incluso la actividad fsica ligera puede ser beneficiosa. Controle sus niveles de colesterol y lpidos en la sangre Comience a realizarse anlisis de lpidos y Oncologist en la sangre a los 20 aos y luego reptalos cada 5 aos. Es posible que Insurance underwriter los niveles de colesterol con mayor frecuencia si: Sus niveles de lpidos y colesterol son altos. Es mayor de 40 aos. Presenta un alto riesgo de padecer enfermedades cardacas. Qu debo  saber sobre las pruebas de deteccin del cncer? Muchos tipos de cncer pueden detectarse de manera temprana y, a menudo, pueden prevenirse. Segn su historia clnica y sus antecedentes familiares, es posible que deba realizarse pruebas de deteccin del cncer en diferentes edades. Esto puede incluir pruebas de deteccin de lo siguiente: Building services engineer. Cncer de prstata. Cncer de piel. Cncer de pulmn. Qu debo saber sobre la enfermedad cardaca, la diabetes y la hipertensin arterial? Presin arterial y enfermedad cardaca La hipertensin arterial causa enfermedades cardacas y [place] el riesgo de accidente cerebrovascular. Es ms probable que esto se manifieste en las personas que tienen lecturas de presin arterial alta o tienen sobrepeso. Hable con el mdico sobre sus valores de presin arterial deseados. Hgase controlar la presin arterial: Cada 3 a 5 aos si tiene entre 18 y 30 aos. Todos los aos si es mayor de 40 aos. Si tiene entre 65 y 65 aos y es fumador o Insurance underwriter, pregntele al mdico si debe realizarse una prueba de deteccin de aneurisma artico abdominal (AAA) por nica vez. Diabetes Realcese exmenes de deteccin de la diabetes con regularidad. Este anlisis revisa el nivel de azcar en la sangre en [place]. Hgase las pruebas de deteccin: Cada tres aos despus de los 45 aos de edad si tiene un peso normal y un bajo riesgo de padecer diabetes. Con ms frecuencia y a partir de [place] edad inferior si tiene sobrepeso o un alto riesgo de padecer diabetes. Qu debo saber sobre la prevencin de infecciones? Hepatitis B Si tiene un riesgo ms alto de contraer hepatitis B, debe someterse a un examen de deteccin de este virus. Hable con el mdico para averiguar si tiene riesgo de  contraer la infeccin por hepatitis B. Hepatitis C Se recomienda un anlisis de [place] para: Todos los que nacieron entre 1945 y [phone]. Todas las personas que tengan un riesgo de haber  contrado hepatitis C. Enfermedades de transmisin sexual (ETS) Debe realizarse pruebas de deteccin de ITS todos los aos, incluidas la gonorrea y la clamidia, si: Es sexualmente activo y es menor de [address]. Es mayor de [address], y Public affairs consultant informa que corre riesgo de tener este tipo de infecciones. La actividad sexual ha cambiado desde que le hicieron la ltima prueba de deteccin y tiene un riesgo mayor de Warehouse manager clamidia o Copy. Pregntele al mdico si usted tiene riesgo. Pregntele al mdico si usted tiene un alto riesgo de Primary school teacher VIH. El mdico tambin puede recomendarle un medicamento recetado para ayudar a evitar la infeccin por el VIH. Si elige tomar medicamentos para prevenir el VIH, primero debe [employer]OK de deteccin del VIH. Luego debe hacerse anlisis cada 3 meses mientras est tomando los medicamentos. Siga estas indicaciones en su casa: Consumo de alcohol No beba alcohol si el mdico se lo prohbe. Si bebe alcohol: Limite la cantidad que consume de 0 a 2 bebidas por da. Sepa cunta cantidad de alcohol hay en las bebidas que toma. En los [address], una medida equivale a una botella de cerveza de 12 oz (355 ml), un vaso de vino de 5 oz (148 ml) o un vaso de una bebida alcohlica de alta graduacin de 1 oz (44 ml). Estilo de vida No consuma ningn producto que contenga nicotina o tabaco. Estos productos incluyen cigarrillos, tabaco para Theatre manager y aparatos de vapeo, como los Administrator, Civil Service. Si necesita ayuda para dejar de consumir estos productos, consulte al mdico. No consuma drogas. No comparta agujas. Solicite ayuda a su mdico si necesita apoyo o informacin para abandonar las drogas. Indicaciones generales Realcese los estudios de rutina de [address], dentales y de Wellsite geologist. Mantngase al da con las vacunas. Infrmele a su mdico si: Se siente deprimido con frecuencia. Alguna vez ha sido vctima de [place] o no se siente seguro en su  casa. Resumen Adoptar un estilo de vida saludable y recibir atencin preventiva son importantes para promover la salud y Counsellor. Siga las instrucciones del mdico acerca de una dieta saludable, el ejercicio y la realizacin de pruebas o exmenes para Hotel manager. Siga las instrucciones del mdico con respecto al control del colesterol y la presin arterial. Esta informacin no tiene Theme park manager el consejo del mdico. Asegrese de hacerle al mdico cualquier pregunta que tenga. Document Revised: 07/21/2020 Document Reviewed: 07/21/2020 Elsevier Patient Education  2024 [employer].

## 2022-07-25 NOTE — Assessment & Plan Note (Signed)
Diet and nutrition discussed Benefits of exercise discussed Advised to decrease amount of daily carbohydrate intake and increase amount of plant-based protein in his diet

## 2022-07-25 NOTE — Assessment & Plan Note (Signed)
Well-controlled hypertension off medication for the last 6 months. Dietary approaches to stop hypertension discussed Continue monitoring blood pressure readings at home Advised to contact the office if numbers persistently abnormal

## 2022-07-25 NOTE — Progress Notes (Signed)
Barry Roy 57 y.o.   Chief Complaint  Patient presents with   Follow-up    6 month follow up    HISTORY OF PRESENT ILLNESS: This is a 57 y.o. male here for 79-month follow-up of chronic medical conditions Has prediabetes and hypertension.  Not on any medications at present time Overall doing well.  Has no complaints or medical concerns today.  HPI   Prior to Admission medications   Medication Sig Start Date End Date Taking? Authorizing Provider  lisinopril (ZESTRIL) 10 MG tablet TAKE 1 TABLET(10 MG) BY MOUTH DAILY 04/04/22   Georgina Quint, MD    No Known Allergies  Patient Active Problem List   Diagnosis Date Noted   Prediabetes 04/04/2021   Essential hypertension 01/13/2021    Past Medical History:  Diagnosis Date   Hypertension     Past Surgical History:  Procedure Laterality Date   FINGER SURGERY Left    fifth finger; work injury   HYDROCELE EXCISION Left 05/17/2021   Procedure: HYDROCELECTOMY ADULT;  Surgeon: Noel Christmas, MD;  Location: [facility];  Service: Urology;  Laterality: Left;  1 HR   VASECTOMY      Social History   Socioeconomic History   Marital status: Married    Spouse name: [name]   Number of children: 3   Years of education: 5th grade   Highest education level: 6th grade  Occupational History   Occupation: Holiday representative    Comment: Scientist, water quality  Tobacco Use   Smoking status: Never   Smokeless tobacco: Never  Substance and Sexual Activity   Alcohol use: No    Alcohol/week: 0.0 standard drinks of alcohol   Drug use: No   Sexual activity: Yes    Partners: Female  Other Topics Concern   Not on file  Social History Narrative   From [place], [place]. Moved to the [place] in 1985.   Lives with his wife, son and daughter-in-law.   2 sons live on-campus at [employer] and [address].   Social Determinants of Health   Financial Resource Strain: Low Risk  (07/23/2022)   Overall Financial Resource Strain (CARDIA)     Difficulty of Paying Living Expenses: Not hard at all  Food Insecurity: No Food Insecurity (07/23/2022)   Hunger Vital Sign    Worried About Running Out of Food in the Last Year: Never true    Ran Out of Food in the Last Year: Never true  Transportation Needs: No Transportation Needs (07/23/2022)   PRAPARE - Administrator, Civil Service (Medical): No    Lack of Transportation (Non-Medical): No  Physical Activity: Insufficiently Active (07/23/2022)   Exercise Vital Sign    Days of Exercise per Week: 7 days    Minutes of Exercise per Session: 10 min  Stress: No Stress Concern Present (07/23/2022)   [employer] of Occupational Health - Occupational Stress Questionnaire    Feeling of Stress : Not at all  Social Connections: Moderately Integrated (07/23/2022)   Social Connection and Isolation Panel [NHANES]    Frequency of Communication with Friends and Family: More than three times a week    Frequency of Social Gatherings with Friends and Family: More than three times a week    Attends Religious Services: 1 to 4 times per year    Active Member of [employer] or Organizations: No    Attends Banker Meetings: Not on file    Marital Status: Married  Catering manager Violence: Not  on file    Family History  Problem Relation Age of Onset   Breast cancer Mother    Arthritis Sister        knee     Review of Systems  Constitutional: Negative.  Negative for chills and fever.  HENT: Negative.  Negative for congestion and sore throat.   Respiratory: Negative.  Negative for cough and shortness of breath.   Cardiovascular: Negative.  Negative for chest pain and palpitations.  Gastrointestinal:  Negative for abdominal pain, diarrhea, nausea and vomiting.  Genitourinary: Negative.  Negative for dysuria and hematuria.  Skin: Negative.  Negative for rash.  Neurological: Negative.  Negative for dizziness and headaches.  All other systems reviewed and are  negative.   Vitals:   07/25/22 0811  BP: 128/80  Pulse: 60  Temp: 98 F (36.7 C)  SpO2: 99%    Physical Exam Vitals reviewed.  Constitutional:      Appearance: Normal appearance.  HENT:     Head: Normocephalic.     Mouth/Throat:     Mouth: Mucous membranes are moist.     Pharynx: Oropharynx is clear.  Eyes:     Extraocular Movements: Extraocular movements intact.     Conjunctiva/sclera: Conjunctivae normal.     Pupils: Pupils are equal, round, and reactive to light.  Cardiovascular:     Rate and Rhythm: Normal rate and regular rhythm.     Pulses: Normal pulses.     Heart sounds: Normal heart sounds.  Pulmonary:     Effort: Pulmonary effort is normal.     Breath sounds: Normal breath sounds.  Abdominal:     General: There is no distension.     Palpations: Abdomen is soft.     Tenderness: There is no abdominal tenderness.  Musculoskeletal:     Cervical back: No tenderness.     Right lower leg: No edema.     Left lower leg: No edema.  Lymphadenopathy:     Cervical: No cervical adenopathy.  Skin:    General: Skin is warm and dry.     Capillary Refill: Capillary refill takes less than 2 seconds.  Neurological:     General: No focal deficit present.     Mental Status: He is alert and oriented to person, place, and time.  Psychiatric:        Mood and Affect: Mood normal.        Behavior: Behavior normal.      ASSESSMENT & PLAN: Problem List Items Addressed This Visit       Cardiovascular and Mediastinum   Essential hypertension - Primary    Well-controlled hypertension off medication for the last 6 months. Dietary approaches to stop hypertension discussed Continue monitoring blood pressure readings at home Advised to contact the office if numbers persistently abnormal        Other   Prediabetes    Diet and nutrition discussed Benefits of exercise discussed Advised to decrease amount of daily carbohydrate intake and increase amount of plant-based  protein in his diet      Other Visit Diagnoses     Screening for colon cancer       Relevant Orders   Ambulatory referral to Gastroenterology   Need for vaccination       Relevant Orders   Varicella-zoster vaccine subcutaneous      Patient Instructions  Mantenimiento de la salud en los hombres Health Maintenance, Male Adoptar un estilo de vida saludable y recibir atencin preventiva son importantes para promover  la salud y [place]. Consulte al mdico sobre: El esquema adecuado para hacerse pruebas y exmenes peridicos. Cosas que puede hacer por su cuenta para prevenir enfermedades y [place] sano. Qu debo saber sobre la dieta, el peso y el ejercicio? Consuma una dieta saludable  Consuma una dieta que incluya muchas verduras, frutas, productos lcteos con bajo contenido de [place] y Associate Professor. No consuma muchos alimentos ricos en grasas slidas, azcares agregados o sodio. Mantenga un peso saludable El ndice de masa muscular [facility]) es una medida que puede utilizarse para identificar posibles problemas de [place]. Proporciona una estimacin de la grasa corporal basndose en el peso y la altura. Su mdico puede ayudarle a Engineer, site IMC y a Personnel officer o Pharmacologist un peso saludable. Haga ejercicio con regularidad Haga ejercicio con regularidad. Esta es una de las prcticas ms importantes que puede hacer por su salud. La [employer] de los adultos deben seguir estas pautas: Education officer, environmental, al menos, 150 minutos de actividad fsica por semana. El ejercicio debe aumentar la frecuencia cardaca y Media planner transpirar (ejercicio de intensidad moderada). Hacer ejercicios de fortalecimiento por lo [facility] por semana. Agregue esto a su plan de ejercicio de intensidad moderada. Pase menos tiempo sentado. Incluso la actividad fsica ligera puede ser beneficiosa. Controle sus niveles de colesterol y lpidos en la sangre Comience a realizarse anlisis de lpidos y Oncologist en la sangre a  los 20 aos y luego reptalos cada 5 aos. Es posible que Insurance underwriter los niveles de colesterol con mayor frecuencia si: Sus niveles de lpidos y colesterol son altos. Es mayor de 40 aos. Presenta un alto riesgo de padecer enfermedades cardacas. Qu debo saber sobre las pruebas de deteccin del cncer? Muchos tipos de cncer pueden detectarse de manera temprana y, a menudo, pueden prevenirse. Segn su historia clnica y sus antecedentes familiares, es posible que deba realizarse pruebas de deteccin del cncer en diferentes edades. Esto puede incluir pruebas de deteccin de lo siguiente: Building services engineer. Cncer de prstata. Cncer de piel. Cncer de pulmn. Qu debo saber sobre la enfermedad cardaca, la diabetes y la hipertensin arterial? Presin arterial y enfermedad cardaca La hipertensin arterial causa enfermedades cardacas y [place] el riesgo de accidente cerebrovascular. Es ms probable que esto se manifieste en las personas que tienen lecturas de presin arterial alta o tienen sobrepeso. Hable con el mdico sobre sus valores de presin arterial deseados. Hgase controlar la presin arterial: Cada 3 a 5 aos si tiene entre 18 y 59 aos. Todos los aos si es mayor de 40 aos. Si tiene entre 65 y 108 aos y es fumador o Insurance underwriter, pregntele al mdico si debe realizarse una prueba de deteccin de aneurisma artico abdominal (AAA) por nica vez. Diabetes Realcese exmenes de deteccin de la diabetes con regularidad. Este anlisis revisa el nivel de azcar en la sangre en [place]. Hgase las pruebas de deteccin: Cada tres aos despus de los 45 aos de edad si tiene un peso normal y un bajo riesgo de padecer diabetes. Con ms frecuencia y a partir de [place] edad inferior si tiene sobrepeso o un alto riesgo de padecer diabetes. Qu debo saber sobre la prevencin de infecciones? Hepatitis B Si tiene un riesgo ms alto de contraer hepatitis B, debe someterse a un examen de  deteccin de este virus. Hable con el mdico para averiguar si tiene riesgo de contraer la infeccin por hepatitis B. Hepatitis C Se recomienda un anlisis de [place] para: Todos los que nacieron entre 1945 y [phone].  Todas las personas que tengan un riesgo de haber contrado hepatitis C. Enfermedades de transmisin sexual (ETS) Debe realizarse pruebas de deteccin de ITS todos los aos, incluidas la gonorrea y la clamidia, si: Es sexualmente activo y es menor de [address]. Es mayor de [address], y Public affairs consultant informa que corre riesgo de tener este tipo de infecciones. La actividad sexual ha cambiado desde que le hicieron la ltima prueba de deteccin y tiene un riesgo mayor de Warehouse manager clamidia o Copy. Pregntele al mdico si usted tiene riesgo. Pregntele al mdico si usted tiene un alto riesgo de Primary school teacher VIH. El mdico tambin puede recomendarle un medicamento recetado para ayudar a evitar la infeccin por el VIH. Si elige tomar medicamentos para prevenir el VIH, primero debe [employer] de deteccin del VIH. Luego debe hacerse anlisis cada 3 meses mientras est tomando los medicamentos. Siga estas indicaciones en su casa: Consumo de alcohol No beba alcohol si el mdico se lo prohbe. Si bebe alcohol: Limite la cantidad que consume de 0 a 2 bebidas por da. Sepa cunta cantidad de alcohol hay en las bebidas que toma. En los [address], una medida equivale a una botella de cerveza de 12 oz (355 ml), un vaso de vino de 5 oz (148 ml) o un vaso de una bebida alcohlica de alta graduacin de 1 oz (44 ml). Estilo de vida No consuma ningn producto que contenga nicotina o tabaco. Estos productos incluyen cigarrillos, tabaco para Theatre manager y aparatos de vapeo, como los Administrator, Civil Service. Si necesita ayuda para dejar de consumir estos productos, consulte al mdico. No consuma drogas. No comparta agujas. Solicite ayuda a su mdico si necesita apoyo o informacin para abandonar las  drogas. Indicaciones generales Realcese los estudios de rutina de [address], dentales y de Wellsite geologist. Mantngase al da con las vacunas. Infrmele a su mdico si: Se siente deprimido con frecuencia. Alguna vez ha sido vctima de [place] o no se siente seguro en su casa. Resumen Adoptar un estilo de vida saludable y recibir atencin preventiva son importantes para promover la salud y Counsellor. Siga las instrucciones del mdico acerca de una dieta saludable, el ejercicio y la realizacin de pruebas o exmenes para Hotel manager. Siga las instrucciones del mdico con respecto al control del colesterol y la presin arterial. Esta informacin no tiene Theme park manager el consejo del mdico. Asegrese de hacerle al mdico cualquier pregunta que tenga. Document Revised: 07/21/2020 Document Reviewed: 07/21/2020 Elsevier Patient Education  2024 Elsevier Inc.     Edwina Barth, MD [facility]

## 2022-08-03 ENCOUNTER — Encounter: Payer: Self-pay | Admitting: Gastroenterology

## 2022-08-11 ENCOUNTER — Ambulatory Visit (AMBULATORY_SURGERY_CENTER): Payer: Self-pay

## 2022-08-11 VITALS — Ht 65.0 in | Wt 153.8 lb

## 2022-08-11 DIAGNOSIS — Z1211 Encounter for screening for malignant neoplasm of colon: Secondary | ICD-10-CM

## 2022-08-11 MED ORDER — NA SULFATE-K SULFATE-MG SULF 17.5-3.13-1.6 GM/177ML PO SOLN
1.0000 | Freq: Once | ORAL | 0 refills | Status: AC
Start: 2022-08-11 — End: 2022-08-11

## 2022-08-11 NOTE — Progress Notes (Signed)
No egg or soy allergy known to patient  No issues known to pt with past sedation with any surgeries or procedures Patient denies ever being told they had issues or difficulty with intubation  No FH of Malignant Hyperthermia Pt is not on diet pills Pt is not on  home 02  Pt is not on blood thinners  Pt denies issues with constipation  No A fib or A flutter Have any cardiac testing pending--no  LOA: independent  Patient's chart reviewed by Cathlyn Parsons CNRA prior to previsit and patient appropriate for the [facility].  Previsit completed and red dot placed by patient's name on their procedure day (on provider's schedule).     PV completed with patient using interpreter services. Prep reviewed and all questions answered. Copy of instructions given at [facility]. Pt is self pay made him aware to take goodrx coupon with him to the pharmacy so he can have his prep discounted.

## 2022-08-25 ENCOUNTER — Ambulatory Visit (AMBULATORY_SURGERY_CENTER): Payer: Self-pay | Admitting: Gastroenterology

## 2022-08-25 ENCOUNTER — Encounter: Payer: Self-pay | Admitting: Gastroenterology

## 2022-08-25 VITALS — BP 106/72 | HR 54 | Temp 97.5°F | Resp 17 | Ht 65.0 in | Wt 153.8 lb

## 2022-08-25 DIAGNOSIS — Z1211 Encounter for screening for malignant neoplasm of colon: Secondary | ICD-10-CM

## 2022-08-25 MED ORDER — SODIUM CHLORIDE 0.9 % IV SOLN
500.0000 mL | Freq: Once | INTRAVENOUS | Status: DC
Start: 2022-08-25 — End: 2022-08-25

## 2022-08-25 NOTE — Progress Notes (Signed)
Vss nad trans to pacu 

## 2022-08-25 NOTE — Progress Notes (Signed)
Pt's states no medical or surgical changes since previsit or office visit.  Interpreter used today at the [facility] for this pt.  Interpreter's name is- Alinda Money

## 2022-08-25 NOTE — Progress Notes (Signed)
Interpreter, Stark Klein , present during recovery.

## 2022-08-25 NOTE — Patient Instructions (Addendum)
USTED TUVO UN PROCEDIMIENTO ENDOSCPICO HOY EN EL [facility]:   Lea el informe del procedimiento que se le entreg para cualquier pregunta especfica sobre lo que se encontr durante su examen.  Si el informe del examen no responde a sus preguntas, por favor llame a su gastroenterlogo para aclararlo.  Si usted solicit que no se le den detalles de lo que se encontr en su procedimiento al acompaante que le va a cuidar, entonces el informe del procedimiento se ha incluido en un sobre sellado para que usted lo revise despus cuando le sea ms conveniente.   LO QUE PUEDE ESPERAR: Algunas sensaciones de hinchazn en el abdomen.  Puede tener ms gases de lo normal.  El caminar puede ayudarle a eliminar el aire que se le puso en el tracto gastrointestinal durante el procedimiento y reducir la hinchazn.  Si le hicieron una endoscopia inferior (como una colonoscopia o una sigmoidoscopia flexible), podra notar manchas de sangre en las heces fecales o en el papel higinico.  Si se someti a una preparacin intestinal para su procedimiento, es posible que no tenga una evacuacin intestinal normal durante algunos das.   Tenga en cuenta:  Es posible que note un poco de irritacin y congestin en la nariz o algn drenaje.  Esto es debido al oxgeno utilizado durante su procedimiento.  No hay que preocuparse y esto debe desaparecer ms o menos en un da.   SNTOMAS PARA REPORTAR INMEDIATAMENTE:  Despus de una endoscopia inferior (colonoscopia o sigmoidoscopia flexible):  Cantidades excesivas de sangre en las heces fecales  Sensibilidad significativa o empeoramiento de los dolores abdominales   Hinchazn aguda del abdomen que antes no tena   Fiebre de 100F o ms   Para asuntos urgentes o de emergencia, puede comunicarse con un gastroenterlogo a cualquier hora llamando al [phone].  DIETA:  Recomendamos una comida pequea al principio, pero luego puede continuar con su dieta normal.  Tome  muchos lquidos, pero debe evitar las bebidas alcohlicas durante 24 horas.    ACTIVIDAD:  Debe planear tomarse las cosas con calma por el resto del da y no debe CONDUCIR ni usar maquinaria pesada hasta maana (debido a los medicamentos de sedacin utilizados durante el examen).     SEGUIMIENTO: Nuestro personal llamar al nmero que aparece en su historial al siguiente da hbil de su procedimiento para ver cmo se siente y para responder cualquier pregunta o inquietud que pueda tener con respecto a la informacin que se le dio despus del procedimiento. Si no podemos contactarle, le dejaremos un mensaje.  Sin embargo, si se siente bien y no tiene ningn problema, no es necesario que nos devuelva la llamada.  Asumiremos que ha regresado a sus actividades diarias normales sin incidentes. Si se le tomaron algunas biopsias, le contactaremos por telfono o por carta en las prximas 3 semanas.  Si no ha sabido nada sobre las biopsias en el transcurso de 3 semanas, por favor llmenos al [phone].   FIRMAS/CONFIDENCIALIDAD: Usted y/o el acompaante que le cuide han firmado documentos que se ingresarn en su historial mdico electrnico.  Estas firmas atestiguan el hecho de que la informacin anterior  

## 2022-08-25 NOTE — Op Note (Signed)
[facility] Patient Name: Barry Roy Procedure Date: 08/25/2022 8:55 AM MRN: [id number] Endoscopist: Lynann Bologna , MD, [id number] Age: 57 Referring MD:  Date of Birth: [birth year] Gender: Male Account #: [id number] Procedure:                Colonoscopy Indications:              Screening for colorectal malignant neoplasm Medicines:                Monitored Anesthesia Care Procedure:                Pre-Anesthesia Assessment:                           - Prior to the procedure, a History and Physical                            was performed, and patient medications and                            allergies were reviewed. The patient's tolerance of                            previous anesthesia was also reviewed. The risks                            and benefits of the procedure and the sedation                            options and risks were discussed with the patient.                            All questions were answered, and informed consent                            was obtained. Prior Anticoagulants: The patient has                            taken no anticoagulant or antiplatelet agents. ASA                            Grade Assessment: II - A patient with mild systemic                            disease. After reviewing the risks and benefits,                            the patient was deemed in satisfactory condition to                            undergo the procedure.                           After obtaining informed consent, the colonoscope  was passed under direct vision. Throughout the                            procedure, the patient's blood pressure, pulse, and                            oxygen saturations were monitored continuously. The                            Olympus Scope [id number] was introduced through the                            anus and advanced to the 2 cm into the ileum. The                             colonoscopy was performed without difficulty. The                            patient tolerated the procedure well. The quality                            of the bowel preparation was good except some                            retained stool in the cecum. The terminal ileum,                            ileocecal valve, appendiceal orifice, and rectum                            were photographed. Scope In: 9:00:42 AM Scope Out: 9:09:35 AM Scope Withdrawal Time: 0 hours 6 minutes 24 seconds  Total Procedure Duration: 0 hours 8 minutes 53 seconds  Findings:                 Rare small-mouthed diverticula were found in the                            sigmoid colon. A single small nonbleeding AVM was                            noted in the cecum.                           Non-bleeding internal hemorrhoids were found during                            retroflexion. The hemorrhoids were small and Grade                            I (internal hemorrhoids that do not prolapse).                           The terminal ileum appeared normal.  The exam was otherwise without abnormality on                            direct and retroflexion views. Complications:            No immediate complications. Estimated Blood Loss:     Estimated blood loss: none. Impression:               - Minimal sigmoid diverticulosis                           - Non-bleeding internal hemorrhoids.                           - The examined portion of the ileum was normal.                           - The examination was otherwise normal on direct                            and retroflexion views.                           - No specimens collected. Recommendation:           - Patient has a contact number available for                            emergencies. The signs and symptoms of potential                            delayed complications were discussed with the                            patient. Return to  normal activities tomorrow.                            Written discharge instructions were provided to the                            patient.                           - Resume previous diet.                           - Continue present medications.                           - Repeat colonoscopy in 10 years for screening                            purposes. Earlier, if with any new problems or                            change in family history.                           -  The findings and recommendations were discussed                            with the patient's family. Lynann Bologna, MD 08/25/2022 9:13:10 AM This report has been signed electronically.

## 2022-08-25 NOTE — Progress Notes (Signed)
[facility] History and Physical   Primary Care Physician:  Georgina Quint, MD   Reason for Procedure:   CRC screening  Plan:     colonoscopy     HPI: Barry Roy is a 57 y.o. male    Past Medical History:  Diagnosis Date   Hypertension     Past Surgical History:  Procedure Laterality Date   FINGER SURGERY Left    fifth finger; work injury   HYDROCELE EXCISION Left 05/17/2021   Procedure: HYDROCELECTOMY ADULT;  Surgeon: Noel Christmas, MD;  Location: [facility];  Service: Urology;  Laterality: Left;  1 HR   VASECTOMY      Prior to Admission medications   Medication Sig Start Date End Date Taking? Authorizing Provider  IBUPROFEN PO Take by mouth as needed.    [provider]    Current Outpatient Medications  Medication Sig Dispense Refill   IBUPROFEN PO Take by mouth as needed.     No current facility-administered medications for this visit.    Allergies as of 08/25/2022   (No Known Allergies)    Family History  Problem Relation Age of Onset   Breast cancer Mother    Arthritis Sister        knee   Colon cancer Neg Hx    Rectal cancer Neg Hx    Stomach cancer Neg Hx    Esophageal cancer Neg Hx     Social History   Socioeconomic History   Marital status: Married    Spouse name: [name]   Number of children: 3   Years of education: 5th grade   Highest education level: 6th grade  Occupational History   Occupation: Holiday representative    Comment: Scientist, water quality  Tobacco Use   Smoking status: Never   Smokeless tobacco: Never  Vaping Use   Vaping Use: Never used  Substance and Sexual Activity   Alcohol use: No    Alcohol/week: 0.0 standard drinks of alcohol   Drug use: No   Sexual activity: Yes    Partners: Female  Other Topics Concern   Not on file  Social History Narrative   From [place], [place]. Moved to the [place] in 1985.   Lives with his wife, son and daughter-in-law.   2 sons live  on-campus at [employer] and [address].   Social Determinants of Health   Financial Resource Strain: Low Risk  (07/23/2022)   Overall Financial Resource Strain (CARDIA)    Difficulty of Paying Living Expenses: Not hard at all  Food Insecurity: No Food Insecurity (07/23/2022)   Hunger Vital Sign    Worried About Running Out of Food in the Last Year: Never true    Ran Out of Food in the Last Year: Never true  Transportation Needs: No Transportation Needs (07/23/2022)   PRAPARE - Administrator, Civil Service (Medical): No    Lack of Transportation (Non-Medical): No  Physical Activity: Insufficiently Active (07/23/2022)   Exercise Vital Sign    Days of Exercise per Week: 7 days    Minutes of Exercise per Session: 10 min  Stress: No Stress Concern Present (07/23/2022)   [employer] of Occupational Health - Occupational Stress Questionnaire    Feeling of Stress : Not at all  Social Connections: Moderately Integrated (07/23/2022)   Social Connection and Isolation Panel [NHANES]    Frequency of Communication with Friends and Family: More than three times a week    Frequency of Social Gatherings  with Friends and Family: More than three times a week    Attends Religious Services: 1 to 4 times per year    Active Member of Clubs or Organizations: No    Attends Engineer, structural: Not on file    Marital Status: Married  Catering manager Violence: Not on file    Review of Systems: Positive for none All other review of systems negative except as mentioned in the HPI.  Physical Exam: Vital signs in last 24 hours: @VSRANGES @   General:   Alert,  Well-developed, well-nourished, pleasant and cooperative in NAD Lungs:  Clear throughout to auscultation.   Heart:  Regular rate and rhythm; no murmurs, clicks, rubs,  or gallops. Abdomen:  Soft, nontender and nondistended. Normal bowel sounds.   Neuro/Psych:  Alert and cooperative. Normal mood and affect. A and O x 3    No  significant changes were identified.  The patient continues to be an appropriate candidate for the planned procedure and anesthesia.   Edman Circle, MD. [facility] 08/25/2022 9:14 AM@

## 2022-08-28 ENCOUNTER — Telehealth: Payer: Self-pay

## 2022-08-28 NOTE — Telephone Encounter (Signed)
  Follow up Call-     08/25/2022    7:26 AM  Call back number  Post procedure Call Back phone  # [phone]  Permission to leave phone message Yes     Patient questions:  Do you have a fever, pain , or abdominal swelling? No. Pain Score  0 *  Have you tolerated food without any problems? Yes.    Have you been able to return to your normal activities? Yes.    Do you have any questions about your discharge instructions: Diet   No. Medications  No. Follow up visit  No.  Do you have questions or concerns about your Care? No.  Actions: * If pain score is 4 or above: No action needed, pain <4.

## 2022-12-26 IMAGING — US US SCROTUM
1 series · 14 of 25 positions shown · non-contrast
Comparison: None.

CLINICAL DATA: Left testicular swelling

EXAM:
ULTRASOUND OF SCROTUM
TECHNIQUE: Complete ultrasound examination of the testicles, epididymis, and
other scrotal structures was performed.

[Series 1: us scrotum · 0.06mm/px · 14 of 48 slices shown]
[im 1/48]
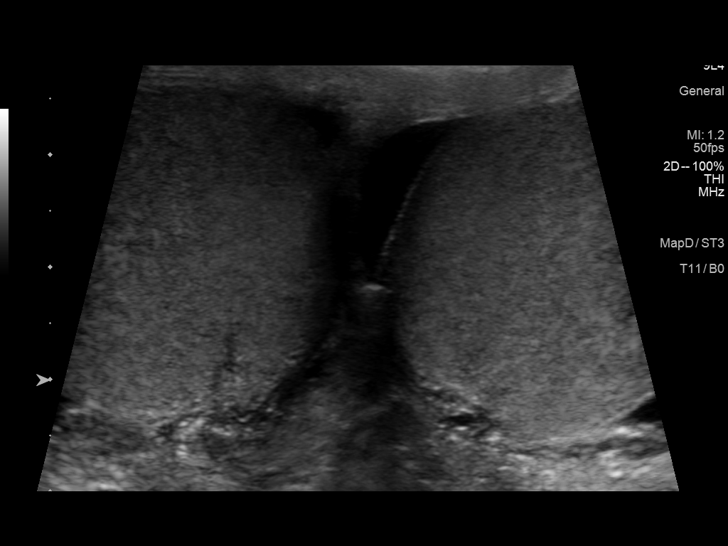
[im 4/48]
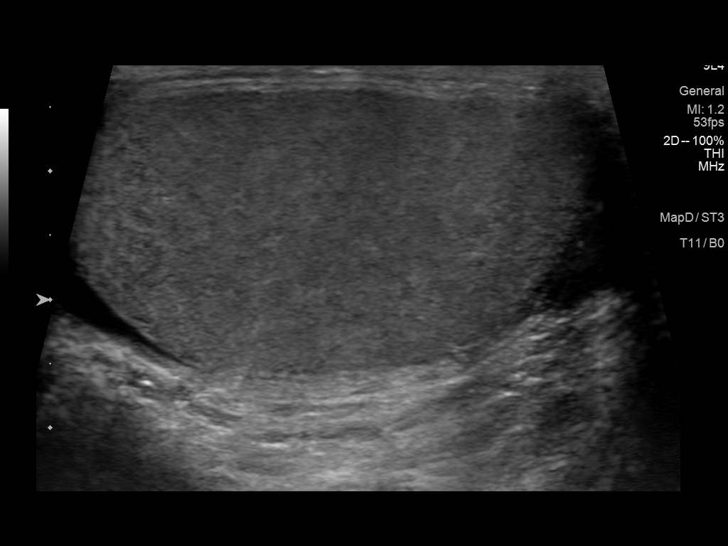
[im 8/48]
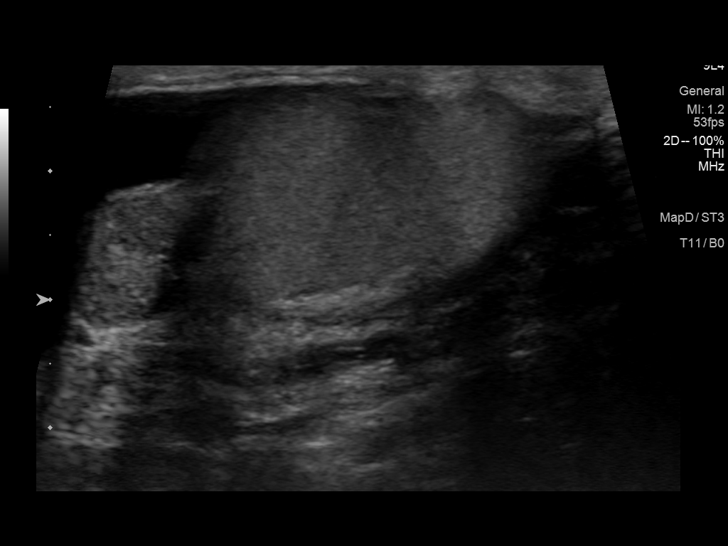
[im 12/48]
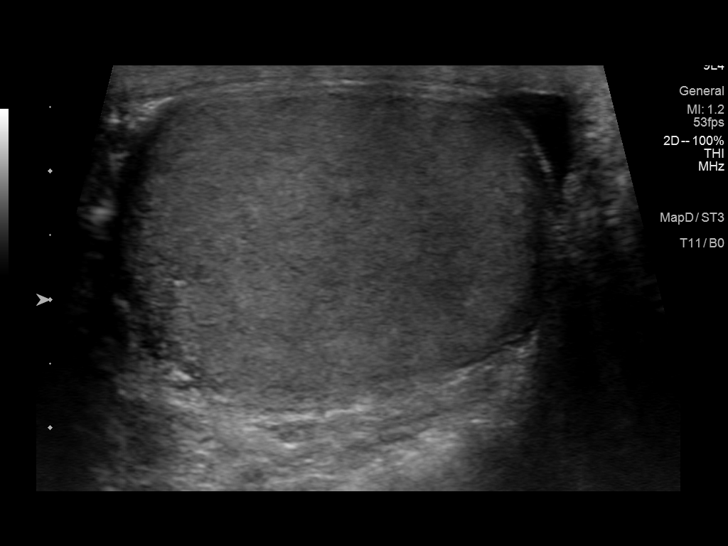
[im 16/48]
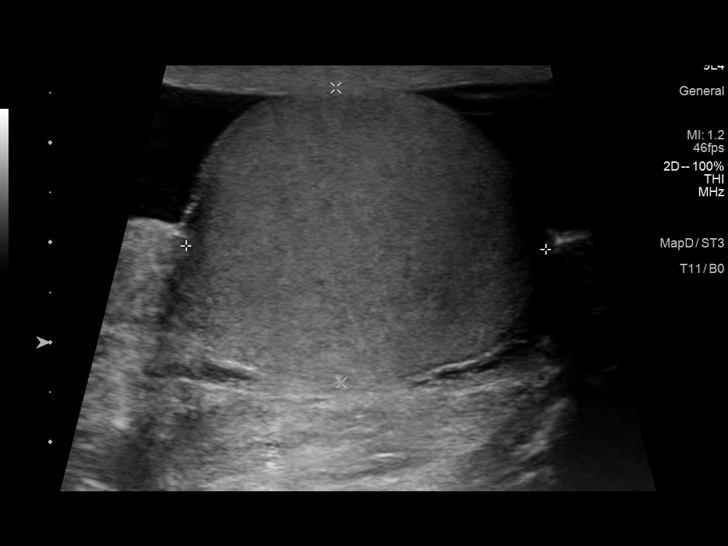
[im 18/48]
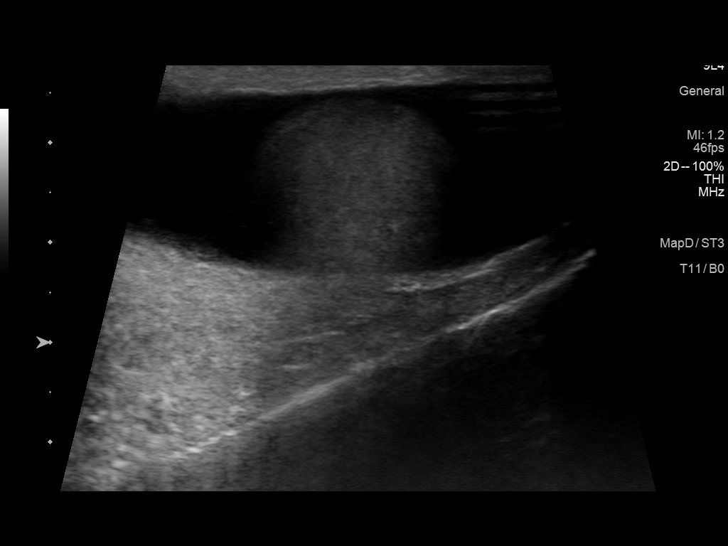
[im 22/48]
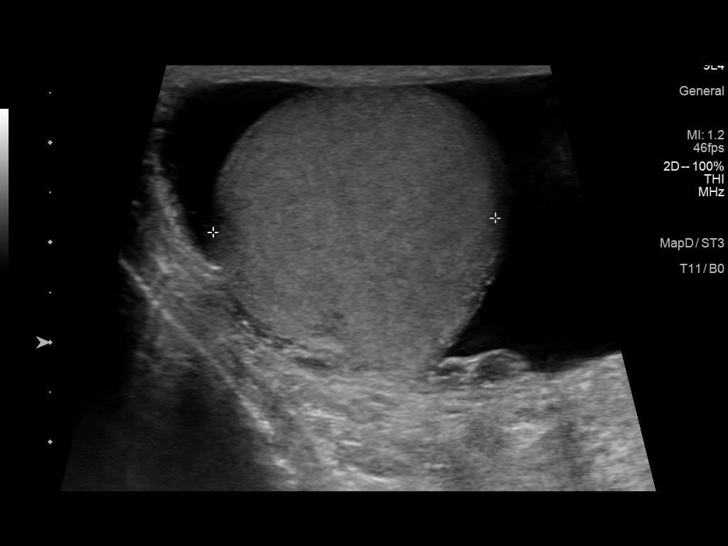
[im 26/48]
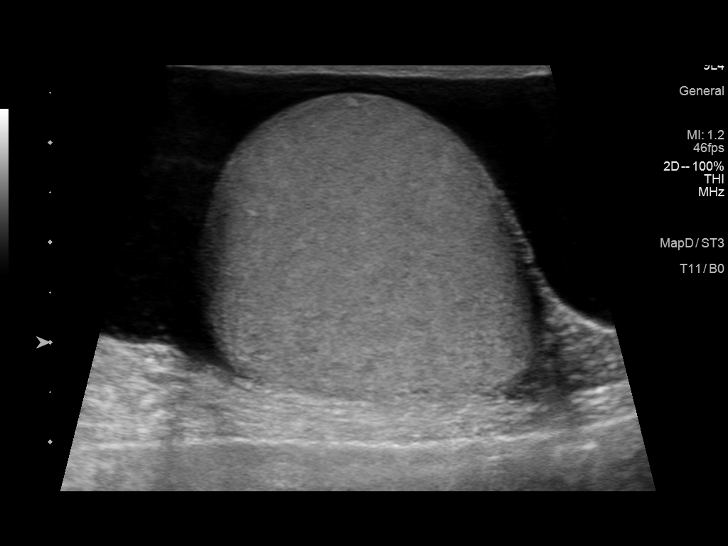
[im 30/48]
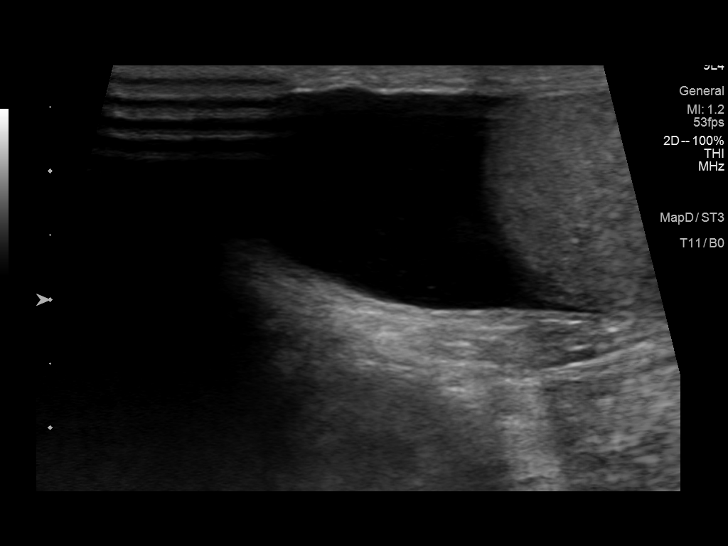
[im 32/48]
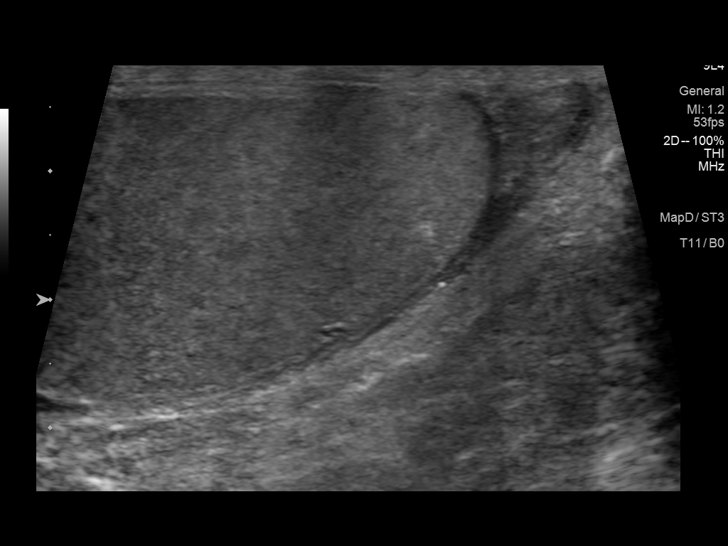
[im 36/48]
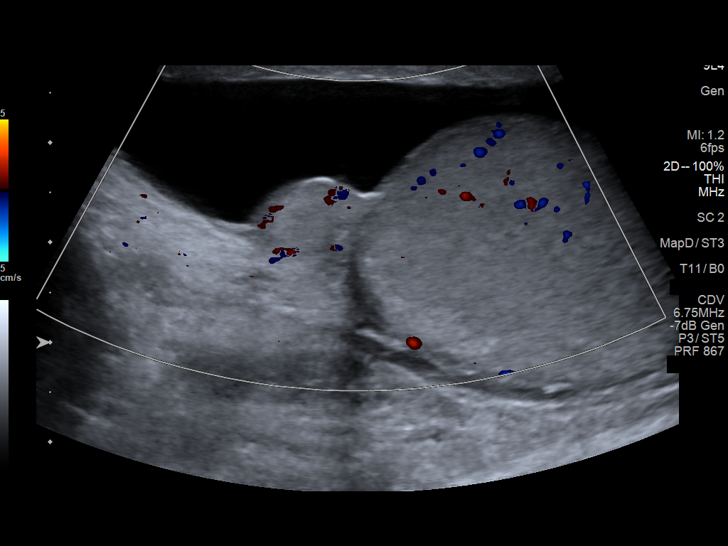
[im 40/48]
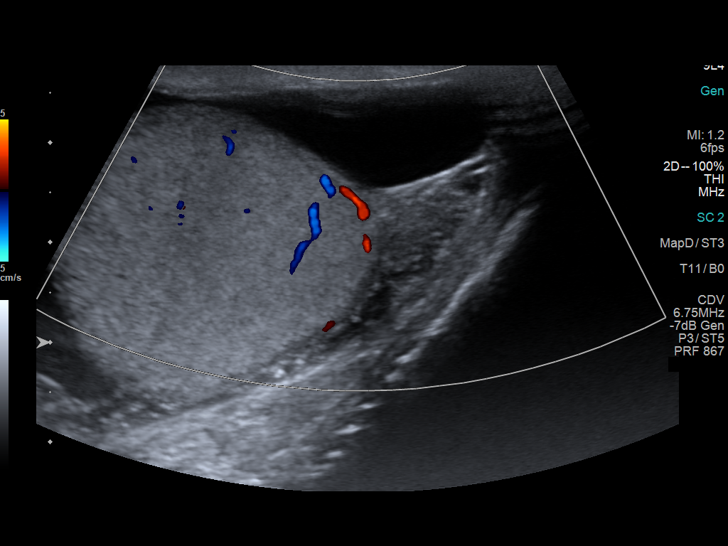
[im 44/48]
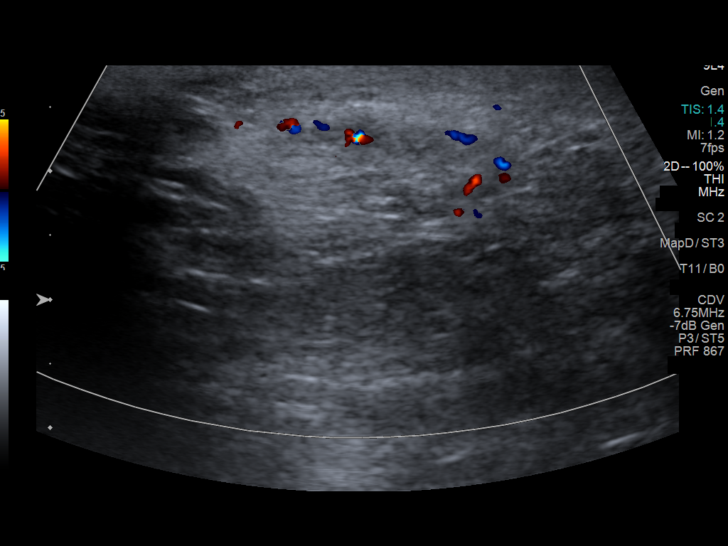
[im 48/48]
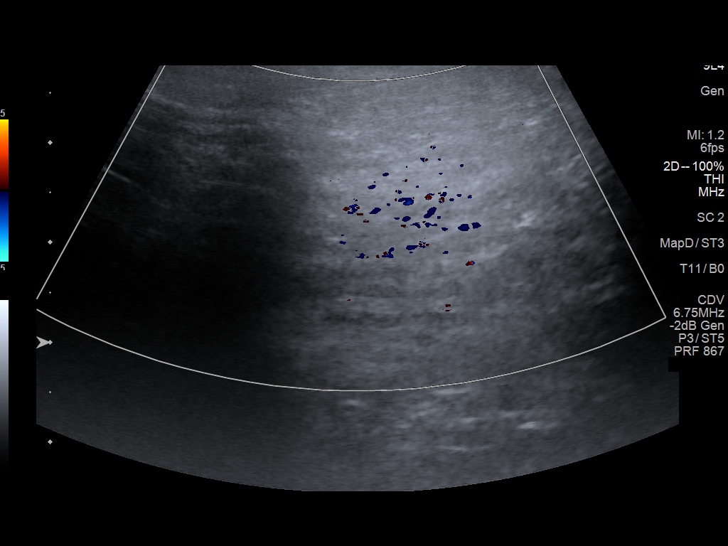

[14 of 25 positions shown; findings below may reference images not displayed]

FINDINGS: Right testicle

Measurements: 4.0 x 2.2 x 3.2 cm. Normal parenchymal echotexture and
echogenicity. Normal color flow vascularity. No mass or
microlithiasis visualized.

Left testicle

Measurements: 3.6 x 3.0 x 2.8 cm. Normal parenchymal echotexture and
echogenicity. Normal color flow vascularity. No mass or
microlithiasis visualized.

Right epididymis:  Normal in size and appearance.

Left epididymis:  Normal in size and appearance.

Hydrocele:  Moderate simple appearing left hydrocele is present

Varicocele:  None visualized.
IMPRESSION: No intratesticular mass identified.

Moderate left hydrocele.

## 2023-01-23 ENCOUNTER — Encounter: Payer: Self-pay | Admitting: Emergency Medicine

## 2023-01-23 ENCOUNTER — Ambulatory Visit (INDEPENDENT_AMBULATORY_CARE_PROVIDER_SITE_OTHER): Payer: Self-pay | Admitting: Emergency Medicine

## 2023-01-23 VITALS — BP 158/88 | HR 60 | Temp 97.9°F | Ht 65.0 in | Wt 161.2 lb

## 2023-01-23 DIAGNOSIS — Z23 Encounter for immunization: Secondary | ICD-10-CM

## 2023-01-23 DIAGNOSIS — R7303 Prediabetes: Secondary | ICD-10-CM

## 2023-01-23 DIAGNOSIS — I10 Essential (primary) hypertension: Secondary | ICD-10-CM

## 2023-01-23 LAB — COMPREHENSIVE METABOLIC PANEL
ALT: 14 U/L (ref 0–53)
AST: 18 U/L (ref 0–37)
Albumin: 4.3 g/dL (ref 3.5–5.2)
Alkaline Phosphatase: 44 U/L (ref 39–117)
BUN: 12 mg/dL (ref 6–23)
CO2: 28 meq/L (ref 19–32)
Calcium: 9.2 mg/dL (ref 8.4–10.5)
Chloride: 107 meq/L (ref 96–112)
Creatinine, Ser: 0.85 mg/dL (ref 0.40–1.50)
GFR: 95.35 mL/min (ref >60.00)
Glucose, Bld: 97 mg/dL (ref 70–99)
Potassium: 4.3 meq/L (ref 3.5–5.1)
Sodium: 139 meq/L (ref 135–145)
Total Bilirubin: 0.7 mg/dL (ref 0.2–1.2)
Total Protein: 7.1 g/dL (ref 6.0–8.3)

## 2023-01-23 LAB — CBC WITH DIFFERENTIAL/PLATELET
Basophils Absolute: 0 10*3/uL (ref 0.0–0.1)
Basophils Relative: 1.1 % (ref 0.0–3.0)
Eosinophils Absolute: 0.1 10*3/uL (ref 0.0–0.7)
Eosinophils Relative: 2.2 % (ref 0.0–5.0)
HCT: 46.8 % (ref 39.0–52.0)
Hemoglobin: 15.6 g/dL (ref 13.0–17.0)
Lymphocytes Relative: 37.9 % (ref 12.0–46.0)
Lymphs Abs: 1.6 10*3/uL (ref 0.7–4.0)
MCHC: 33.3 g/dL (ref 30.0–36.0)
MCV: 94 fL (ref 78.0–100.0)
Monocytes Absolute: 0.3 10*3/uL (ref 0.1–1.0)
Monocytes Relative: 6.7 % (ref 3.0–12.0)
Neutro Abs: 2.2 10*3/uL (ref 1.4–7.7)
Neutrophils Relative %: 52.1 % (ref 43.0–77.0)
Platelets: 215 10*3/uL (ref 150.0–400.0)
RBC: 4.98 Mil/uL (ref 4.22–5.81)
RDW: 13 % (ref 11.5–15.5)
WBC: 4.3 10*3/uL (ref 4.0–10.5)

## 2023-01-23 LAB — LIPID PANEL
Cholesterol: 131 mg/dL (ref 0–200)
HDL: 40.2 mg/dL (ref >39.00)
LDL Cholesterol: 74 mg/dL (ref 0–99)
NonHDL: 91
Total CHOL/HDL Ratio: 3
Triglycerides: 83 mg/dL (ref 0.0–149.0)
VLDL: 16.6 mg/dL (ref 0.0–40.0)

## 2023-01-23 LAB — HEMOGLOBIN A1C: Hgb A1c MFr Bld: 5.8 % (ref 4.6–6.5)

## 2023-01-23 NOTE — Assessment & Plan Note (Signed)
Diet and nutrition discussed Benefits of exercise discussed Advised to decrease amount of daily carbohydrate intake and increase amount of plant-based protein in his diet

## 2023-01-23 NOTE — Patient Instructions (Signed)
Mantenimiento de Research officer, political party, Male Adoptar un estilo de vida saludable y recibir atencin preventiva son importantes para promover la salud y Counsellor. Consulte al mdico sobre: El esquema adecuado para hacerse pruebas y exmenes peridicos. Cosas que puede hacer por su cuenta para prevenir enfermedades y [place] sano. Qu debo saber sobre la dieta, el peso y el ejercicio? Consuma una dieta saludable  Consuma una dieta que incluya muchas verduras, frutas, productos lcteos con bajo contenido de [place] y Associate Professor. No consuma muchos alimentos ricos en grasas slidas, azcares agregados o sodio. Mantenga un peso saludable El ndice de masa muscular [facility]) es una medida que puede utilizarse para identificar posibles problemas de [place]. Proporciona una estimacin de la grasa corporal basndose en el peso y la altura. Su mdico puede ayudarle a Engineer, site [facility] y a Personnel officer o Pharmacologist un peso saludable. Haga ejercicio con regularidad Haga ejercicio con regularidad. Esta es una de las prcticas ms importantes que puede hacer por su salud. La [employer] de los adultos deben seguir estas pautas: Education officer, environmental, al menos, 150 minutos de actividad fsica por semana. El ejercicio debe aumentar la frecuencia cardaca y Media planner transpirar (ejercicio de intensidad moderada). Hacer ejercicios de fortalecimiento por lo [employer] por semana. Agregue esto a su plan de ejercicio de intensidad moderada. Pase menos tiempo sentado. Incluso la actividad fsica ligera puede ser beneficiosa. Controle sus niveles de colesterol y lpidos en la sangre Comience a realizarse anlisis de lpidos y Oncologist en la sangre a los 20 aos y luego reptalos cada 5 aos. Es posible que Insurance underwriter los niveles de colesterol con mayor frecuencia si: Sus niveles de lpidos y colesterol son altos. Es mayor de 40 aos. Presenta un alto riesgo de padecer enfermedades cardacas. Qu debo  saber sobre las pruebas de deteccin del cncer? Muchos tipos de cncer pueden detectarse de manera temprana y, a menudo, pueden prevenirse. Segn su historia clnica y sus antecedentes familiares, es posible que deba realizarse pruebas de deteccin del cncer en diferentes edades. Esto puede incluir pruebas de deteccin de lo siguiente: Building services engineer. Cncer de prstata. Cncer de piel. Cncer de pulmn. Qu debo saber sobre la enfermedad cardaca, la diabetes y la hipertensin arterial? Presin arterial y enfermedad cardaca La hipertensin arterial causa enfermedades cardacas y [place] el riesgo de accidente cerebrovascular. Es ms probable que esto se manifieste en las personas que tienen lecturas de presin arterial alta o tienen sobrepeso. Hable con el mdico sobre sus valores de presin arterial deseados. Hgase controlar la presin arterial: Cada 3 a 5 aos si tiene entre 18 y 44 aos. Todos los aos si es mayor de 40 aos. Si tiene entre 65 y 9 aos y es fumador o Insurance underwriter, pregntele al mdico si debe realizarse una prueba de deteccin de aneurisma artico abdominal (AAA) por nica vez. Diabetes Realcese exmenes de deteccin de la diabetes con regularidad. Este anlisis revisa el nivel de azcar en la sangre en [place]. Hgase las pruebas de deteccin: Cada tres aos despus de los 45 aos de edad si tiene un peso normal y un bajo riesgo de padecer diabetes. Con ms frecuencia y a partir de [place] edad inferior si tiene sobrepeso o un alto riesgo de padecer diabetes. Qu debo saber sobre la prevencin de infecciones? Hepatitis B Si tiene un riesgo ms alto de contraer hepatitis B, debe someterse a un examen de deteccin de este virus. Hable con el mdico para averiguar si tiene riesgo de  contraer la infeccin por hepatitis B. Hepatitis C Se recomienda un anlisis de [place] para: Todos los que nacieron entre 1945 y [phone]. Todas las personas que tengan un riesgo de haber  contrado hepatitis C. Enfermedades de transmisin sexual (ETS) Debe realizarse pruebas de deteccin de ITS todos los aos, incluidas la gonorrea y la clamidia, si: Es sexualmente activo y es menor de [address]. Es mayor de [address], y Public affairs consultant informa que corre riesgo de tener este tipo de infecciones. La actividad sexual ha cambiado desde que le hicieron la ltima prueba de deteccin y tiene un riesgo mayor de Warehouse manager clamidia o Copy. Pregntele al mdico si usted tiene riesgo. Pregntele al mdico si usted tiene un alto riesgo de Primary school teacher VIH. El mdico tambin puede recomendarle un medicamento recetado para ayudar a evitar la infeccin por el VIH. Si elige tomar medicamentos para prevenir el VIH, primero debe [employer] de deteccin del VIH. Luego debe hacerse anlisis cada 3 meses mientras est tomando los medicamentos. Siga estas indicaciones en su casa: Consumo de alcohol No beba alcohol si el mdico se lo prohbe. Si bebe alcohol: Limite la cantidad que consume de 0 a 2 bebidas por da. Sepa cunta cantidad de alcohol hay en las bebidas que toma. En los [address], una medida equivale a una botella de cerveza de 12 oz (355 ml), un vaso de vino de 5 oz (148 ml) o un vaso de una bebida alcohlica de alta graduacin de 1 oz (44 ml). Estilo de vida No consuma ningn producto que contenga nicotina o tabaco. Estos productos incluyen cigarrillos, tabaco para Theatre manager y aparatos de vapeo, como los Administrator, Civil Service. Si necesita ayuda para dejar de consumir estos productos, consulte al mdico. No consuma drogas. No comparta agujas. Solicite ayuda a su mdico si necesita apoyo o informacin para abandonar las drogas. Indicaciones generales Realcese los estudios de rutina de [address], dentales y de Wellsite geologist. Mantngase al da con las vacunas. Infrmele a su mdico si: Se siente deprimido con frecuencia. Alguna vez ha sido vctima de [place] o no se siente seguro en su  casa. Resumen Adoptar un estilo de vida saludable y recibir atencin preventiva son importantes para promover la salud y Counsellor. Siga las instrucciones del mdico acerca de una dieta saludable, el ejercicio y la realizacin de pruebas o exmenes para Hotel manager. Siga las instrucciones del mdico con respecto al control del colesterol y la presin arterial. Esta informacin no tiene Theme park manager el consejo del mdico. Asegrese de hacerle al mdico cualquier pregunta que tenga. Document Revised: 07/21/2020 Document Reviewed: 07/21/2020 Elsevier Patient Education  2024 [employer].

## 2023-01-23 NOTE — Progress Notes (Signed)
Barry Roy 57 y.o.   Chief Complaint  Patient presents with   Medical Management of Chronic Issues    6 Month follow up, essential hypertension. 2nd shingles    HISTORY OF PRESENT ILLNESS: This is a 57 y.o. male here for 50-month follow-up of hypertension and diabetes Needs second shot of shingles.  Requesting also Td vaccine Needs labs. Overall doing well.  Has no complaints or medical concerns today. BP Readings from Last 3 Encounters:  08/25/22 106/72  07/25/22 128/80  01/10/22 130/82   Wt Readings from Last 3 Encounters:  01/23/23 161 lb 3.2 oz (73.1 kg)  08/25/22 153 lb 12.8 oz (69.8 kg)  08/11/22 153 lb 12.8 oz (69.8 kg)      HPI   Prior to Admission medications   Medication Sig Start Date End Date Taking? Authorizing Provider  IBUPROFEN PO Take by mouth as needed.   Yes [provider]    No Known Allergies  Patient Active Problem List   Diagnosis Date Noted   Prediabetes 04/04/2021   Essential hypertension 01/13/2021    Past Medical History:  Diagnosis Date   Hypertension     Past Surgical History:  Procedure Laterality Date   FINGER SURGERY Left    fifth finger; work injury   HYDROCELE EXCISION Left 05/17/2021   Procedure: HYDROCELECTOMY ADULT;  Surgeon: Noel Christmas, MD;  Location: [facility];  Service: Urology;  Laterality: Left;  1 HR   VASECTOMY      Social History   Socioeconomic History   Marital status: Married    Spouse name: [name]   Number of children: 3   Years of education: 5th grade   Highest education level: 2nd grade  Occupational History   Occupation: Holiday representative    Comment: Scientist, water quality  Tobacco Use   Smoking status: Never   Smokeless tobacco: Never  Vaping Use   Vaping status: Never Used  Substance and Sexual Activity   Alcohol use: No    Alcohol/week: 0.0 standard drinks of alcohol   Drug use: No   Sexual activity: Yes    Partners: Female  Other Topics Concern   Not on  file  Social History Narrative   From [place], [place]. Moved to the [place] in 1985.   Lives with his wife, son and daughter-in-law.   2 sons live on-campus at [facility] and [address].   Social Determinants of Health   Financial Resource Strain: Low Risk  (01/22/2023)   Overall Financial Resource Strain (CARDIA)    Difficulty of Paying Living Expenses: Not very hard  Food Insecurity: No Food Insecurity (01/22/2023)   Hunger Vital Sign    Worried About Running Out of Food in the Last Year: Never true    Ran Out of Food in the Last Year: Never true  Transportation Needs: No Transportation Needs (01/22/2023)   PRAPARE - Administrator, Civil Service (Medical): No    Lack of Transportation (Non-Medical): No  Physical Activity: Insufficiently Active (01/22/2023)   Exercise Vital Sign    Days of Exercise per Week: 3 days    Minutes of Exercise per Session: 10 min  Stress: No Stress Concern Present (01/22/2023)   [employer] of Occupational Health - Occupational Stress Questionnaire    Feeling of Stress : Not at all  Social Connections: Moderately Integrated (01/22/2023)   Social Connection and Isolation Panel [NHANES]    Frequency of Communication with Friends and Family: Three times a week  Frequency of Social Gatherings with Friends and Family: Once a week    Attends Religious Services: More than 4 times per year    Active Member of [employer] or Organizations: No    Attends Engineer, structural: Not on file    Marital Status: Married  Catering manager Violence: Not on file    Family History  Problem Relation Age of Onset   Breast cancer Mother    Arthritis Sister        knee   Colon cancer Neg Hx    Rectal cancer Neg Hx    Stomach cancer Neg Hx    Esophageal cancer Neg Hx      Review of Systems  Constitutional: Negative.  Negative for chills and fever.  HENT: Negative.  Negative for congestion and sore throat.   Respiratory: Negative.  Negative for cough  and shortness of breath.   Cardiovascular: Negative.  Negative for chest pain and palpitations.  Gastrointestinal:  Negative for abdominal pain, nausea and vomiting.  Skin: Negative.  Negative for rash.  All other systems reviewed and are negative.   Today's Vitals   01/23/23 0753  BP: (!) 158/88  Pulse: 60  Temp: 97.9 F (36.6 C)  TempSrc: Oral  SpO2: 98%  Weight: 161 lb 3.2 oz (73.1 kg)  Height: 5\' 5"  (1.651 m)   Body mass index is 26.83 kg/m.   Physical Exam Vitals reviewed.  Constitutional:      Appearance: Normal appearance.  HENT:     Head: Normocephalic.     Mouth/Throat:     Mouth: Mucous membranes are moist.     Pharynx: Oropharynx is clear.  Eyes:     Extraocular Movements: Extraocular movements intact.     Pupils: Pupils are equal, round, and reactive to light.  Cardiovascular:     Rate and Rhythm: Normal rate and regular rhythm.     Pulses: Normal pulses.     Heart sounds: Normal heart sounds.  Pulmonary:     Effort: Pulmonary effort is normal.     Breath sounds: Normal breath sounds.  Skin:    General: Skin is warm and dry.  Neurological:     Mental Status: He is alert and oriented to person, place, and time.  Psychiatric:        Mood and Affect: Mood normal.        Behavior: Behavior normal.      ASSESSMENT & PLAN: A total of 32 minutes was spent with the patient and counseling/coordination of care regarding preparing for this visit, review of most recent office visit notes, review of multiple chronic medical conditions and their management, review of all medications, review of most recent bloodwork results, review of health maintenance items, education on nutrition, prognosis, documentation, and need for follow up. .  Problem List Items Addressed This Visit       Cardiovascular and Mediastinum   Essential hypertension - Primary    Well-controlled hypertension with normal blood pressure readings at home. Off medications. Cardiovascular  risks associated with hypertension discussed Dietary approaches to stop hypertension discussed      Relevant Orders   CBC with Differential/Platelet   Comprehensive metabolic panel   Hemoglobin A1c   Lipid panel     Other   Prediabetes    Diet and nutrition discussed Benefits of exercise discussed Advised to decrease amount of daily carbohydrate intake and increase amount of plant-based protein in his diet      Relevant Orders   CBC  with Differential/Platelet   Comprehensive metabolic panel   Hemoglobin A1c   Lipid panel   Other Visit Diagnoses     Need for vaccination       Relevant Orders   Tdap vaccine greater than or equal to 7yo IM      Patient Instructions  Mantenimiento de la salud en los hombres Health Maintenance, Male Adoptar un estilo de vida saludable y recibir atencin preventiva son importantes para promover la salud y Counsellor. Consulte al mdico sobre: El esquema adecuado para hacerse pruebas y exmenes peridicos. Cosas que puede hacer por su cuenta para prevenir enfermedades y [place] sano. Qu debo saber sobre la dieta, el peso y el ejercicio? Consuma una dieta saludable  Consuma una dieta que incluya muchas verduras, frutas, productos lcteos con bajo contenido de [place] y Associate Professor. No consuma muchos alimentos ricos en grasas slidas, azcares agregados o sodio. Mantenga un peso saludable El ndice de masa muscular [facility]) es una medida que puede utilizarse para identificar posibles problemas de [place]. Proporciona una estimacin de la grasa corporal basndose en el peso y la altura. Su mdico puede ayudarle a Engineer, site [facility] y a Personnel officer o Pharmacologist un peso saludable. Haga ejercicio con regularidad Haga ejercicio con regularidad. Esta es una de las prcticas ms importantes que puede hacer por su salud. La [employer] de los adultos deben seguir estas pautas: Education officer, environmental, al menos, 150 minutos de actividad fsica por semana. El ejercicio debe  aumentar la frecuencia cardaca y Media planner transpirar (ejercicio de intensidad moderada). Hacer ejercicios de fortalecimiento por lo [employer] por semana. Agregue esto a su plan de ejercicio de intensidad moderada. Pase menos tiempo sentado. Incluso la actividad fsica ligera puede ser beneficiosa. Controle sus niveles de colesterol y lpidos en la sangre Comience a realizarse anlisis de lpidos y Oncologist en la sangre a los 20 aos y luego reptalos cada 5 aos. Es posible que Insurance underwriter los niveles de colesterol con mayor frecuencia si: Sus niveles de lpidos y colesterol son altos. Es mayor de 40 aos. Presenta un alto riesgo de padecer enfermedades cardacas. Qu debo saber sobre las pruebas de deteccin del cncer? Muchos tipos de cncer pueden detectarse de manera temprana y, a menudo, pueden prevenirse. Segn su historia clnica y sus antecedentes familiares, es posible que deba realizarse pruebas de deteccin del cncer en diferentes edades. Esto puede incluir pruebas de deteccin de lo siguiente: Building services engineer. Cncer de prstata. Cncer de piel. Cncer de pulmn. Qu debo saber sobre la enfermedad cardaca, la diabetes y la hipertensin arterial? Presin arterial y enfermedad cardaca La hipertensin arterial causa enfermedades cardacas y [place] el riesgo de accidente cerebrovascular. Es ms probable que esto se manifieste en las personas que tienen lecturas de presin arterial alta o tienen sobrepeso. Hable con el mdico sobre sus valores de presin arterial deseados. Hgase controlar la presin arterial: Cada 3 a 5 aos si tiene entre 18 y 25 aos. Todos los aos si es mayor de 40 aos. Si tiene entre 65 y 49 aos y es fumador o Insurance underwriter, pregntele al mdico si debe realizarse una prueba de deteccin de aneurisma artico abdominal (AAA) por nica vez. Diabetes Realcese exmenes de deteccin de la diabetes con regularidad. Este anlisis revisa el nivel  de azcar en la sangre en [place]. Hgase las pruebas de deteccin: Cada tres aos despus de los 45 aos de edad si tiene un peso normal y un bajo riesgo de padecer diabetes. Con ms frecuencia y a  partir de una edad inferior si tiene sobrepeso o un alto riesgo de padecer diabetes. Qu debo saber sobre la prevencin de infecciones? Hepatitis B Si tiene un riesgo ms alto de contraer hepatitis B, debe someterse a un examen de deteccin de este virus. Hable con el mdico para averiguar si tiene riesgo de contraer la infeccin por hepatitis B. Hepatitis C Se recomienda un anlisis de [place] para: Todos los que nacieron entre 1947 y [phone]. Todas las personas que tengan un riesgo de haber contrado hepatitis C. Enfermedades de transmisin sexual (ETS) Debe realizarse pruebas de deteccin de ITS todos los aos, incluidas la gonorrea y la clamidia, si: Es sexualmente activo y es menor de [address]. Es mayor de [address], y Public affairs consultant informa que corre riesgo de tener este tipo de infecciones. La actividad sexual ha cambiado desde que le hicieron la ltima prueba de deteccin y tiene un riesgo mayor de Warehouse manager clamidia o Copy. Pregntele al mdico si usted tiene riesgo. Pregntele al mdico si usted tiene un alto riesgo de Primary school teacher VIH. El mdico tambin puede recomendarle un medicamento recetado para ayudar a evitar la infeccin por el VIH. Si elige tomar medicamentos para prevenir el VIH, primero debe [employer] de deteccin del VIH. Luego debe hacerse anlisis cada 3 meses mientras est tomando los medicamentos. Siga estas indicaciones en su casa: Consumo de alcohol No beba alcohol si el mdico se lo prohbe. Si bebe alcohol: Limite la cantidad que consume de 0 a 2 bebidas por da. Sepa cunta cantidad de alcohol hay en las bebidas que toma. En los [address], una medida equivale a una botella de cerveza de 12 oz (355 ml), un vaso de vino de 5 oz (148 ml) o un vaso de una bebida  alcohlica de alta graduacin de 1 oz (44 ml). Estilo de vida No consuma ningn producto que contenga nicotina o tabaco. Estos productos incluyen cigarrillos, tabaco para Theatre manager y aparatos de vapeo, como los Administrator, Civil Service. Si necesita ayuda para dejar de consumir estos productos, consulte al mdico. No consuma drogas. No comparta agujas. Solicite ayuda a su mdico si necesita apoyo o informacin para abandonar las drogas. Indicaciones generales Realcese los estudios de rutina de [address], dentales y de Wellsite geologist. Mantngase al da con las vacunas. Infrmele a su mdico si: Se siente deprimido con frecuencia. Alguna vez ha sido vctima de [place] o no se siente seguro en su casa. Resumen Adoptar un estilo de vida saludable y recibir atencin preventiva son importantes para promover la salud y Counsellor. Siga las instrucciones del mdico acerca de una dieta saludable, el ejercicio y la realizacin de pruebas o exmenes para Hotel manager. Siga las instrucciones del mdico con respecto al control del colesterol y la presin arterial. Esta informacin no tiene Theme park manager el consejo del mdico. Asegrese de hacerle al mdico cualquier pregunta que tenga. Document Revised: 07/21/2020 Document Reviewed: 07/21/2020 Elsevier Patient Education  2024 Elsevier Inc.     Edwina Barth, MD [facility]

## 2023-01-23 NOTE — Assessment & Plan Note (Signed)
Well-controlled hypertension with normal blood pressure readings at home. Off medications. Cardiovascular risks associated with hypertension discussed Dietary approaches to stop hypertension discussed
